# Patient Record
Sex: Female | Born: 1935 | Race: White | Hispanic: No | Marital: Married | Smoking: Former smoker
Health system: Southern US, Community
[De-identification: ages and names within clinical notes are randomized; demographics above are authoritative.]

## PROBLEM LIST (undated history)

## (undated) DIAGNOSIS — I340 Nonrheumatic mitral (valve) insufficiency: Secondary | ICD-10-CM

## (undated) DIAGNOSIS — I739 Peripheral vascular disease, unspecified: Principal | ICD-10-CM

## (undated) DIAGNOSIS — I2510 Atherosclerotic heart disease of native coronary artery without angina pectoris: Secondary | ICD-10-CM

## (undated) DIAGNOSIS — K219 Gastro-esophageal reflux disease without esophagitis: Secondary | ICD-10-CM

## (undated) DIAGNOSIS — R9430 Abnormal result of cardiovascular function study, unspecified: Secondary | ICD-10-CM

## (undated) DIAGNOSIS — R05 Cough: Secondary | ICD-10-CM

## (undated) DIAGNOSIS — IMO0002 Reserved for concepts with insufficient information to code with codable children: Secondary | ICD-10-CM

## (undated) DIAGNOSIS — G309 Alzheimer's disease, unspecified: Secondary | ICD-10-CM

## (undated) DIAGNOSIS — I779 Disorder of arteries and arterioles, unspecified: Secondary | ICD-10-CM

## (undated) DIAGNOSIS — F419 Anxiety disorder, unspecified: Secondary | ICD-10-CM

## (undated) DIAGNOSIS — F0280 Dementia in other diseases classified elsewhere without behavioral disturbance: Secondary | ICD-10-CM

## (undated) DIAGNOSIS — R059 Cough, unspecified: Secondary | ICD-10-CM

## (undated) HISTORY — DX: Gastro-esophageal reflux disease without esophagitis: K21.9

## (undated) HISTORY — DX: Cough: R05

## (undated) HISTORY — DX: Reserved for concepts with insufficient information to code with codable children: IMO0002

## (undated) HISTORY — DX: Nonrheumatic mitral (valve) insufficiency: I34.0

## (undated) HISTORY — DX: Cough, unspecified: R05.9

## (undated) HISTORY — DX: Disorder of arteries and arterioles, unspecified: I77.9

## (undated) HISTORY — DX: Abnormal result of cardiovascular function study, unspecified: R94.30

## (undated) HISTORY — PX: CHOLECYSTECTOMY: SHX55

## (undated) HISTORY — DX: Peripheral vascular disease, unspecified: I73.9

---

## 1997-05-22 ENCOUNTER — Encounter: Admission: RE | Admit: 1997-05-22 | Discharge: 1997-08-20 | Payer: Self-pay | Admitting: *Deleted

## 2000-06-06 ENCOUNTER — Other Ambulatory Visit: Admission: RE | Admit: 2000-06-06 | Discharge: 2000-06-06 | Payer: Self-pay | Admitting: Obstetrics and Gynecology

## 2003-11-20 ENCOUNTER — Ambulatory Visit (HOSPITAL_COMMUNITY): Admission: RE | Admit: 2003-11-20 | Discharge: 2003-11-20 | Payer: Self-pay | Admitting: Neurology

## 2005-06-11 ENCOUNTER — Ambulatory Visit (HOSPITAL_BASED_OUTPATIENT_CLINIC_OR_DEPARTMENT_OTHER): Admission: RE | Admit: 2005-06-11 | Discharge: 2005-06-11 | Payer: Self-pay | Admitting: Orthopedic Surgery

## 2006-10-28 ENCOUNTER — Ambulatory Visit: Payer: Self-pay

## 2008-07-16 ENCOUNTER — Encounter: Payer: Self-pay | Admitting: Pulmonary Disease

## 2008-07-16 ENCOUNTER — Encounter: Payer: Self-pay | Admitting: Cardiology

## 2008-12-02 ENCOUNTER — Encounter: Payer: Self-pay | Admitting: Cardiology

## 2008-12-03 ENCOUNTER — Ambulatory Visit: Payer: Self-pay | Admitting: Cardiology

## 2008-12-09 ENCOUNTER — Telehealth (INDEPENDENT_AMBULATORY_CARE_PROVIDER_SITE_OTHER): Payer: Self-pay | Admitting: *Deleted

## 2008-12-10 ENCOUNTER — Ambulatory Visit: Payer: Self-pay | Admitting: Cardiology

## 2008-12-10 ENCOUNTER — Encounter (HOSPITAL_COMMUNITY): Admission: RE | Admit: 2008-12-10 | Discharge: 2009-01-30 | Payer: Self-pay | Admitting: Cardiology

## 2008-12-10 ENCOUNTER — Ambulatory Visit: Payer: Self-pay

## 2008-12-13 ENCOUNTER — Ambulatory Visit: Payer: Self-pay

## 2008-12-13 ENCOUNTER — Ambulatory Visit: Payer: Self-pay | Admitting: Cardiology

## 2009-05-01 ENCOUNTER — Ambulatory Visit (HOSPITAL_BASED_OUTPATIENT_CLINIC_OR_DEPARTMENT_OTHER): Admission: RE | Admit: 2009-05-01 | Discharge: 2009-05-01 | Payer: Self-pay | Admitting: Orthopedic Surgery

## 2010-04-26 LAB — BASIC METABOLIC PANEL
BUN: 24 mg/dL — ABNORMAL HIGH (ref 6–23)
CO2: 26 mEq/L (ref 19–32)
Calcium: 9.4 mg/dL (ref 8.4–10.5)
Chloride: 102 mEq/L (ref 96–112)
Creatinine, Ser: 1.15 mg/dL (ref 0.4–1.2)
GFR calc Af Amer: 56 mL/min — ABNORMAL LOW (ref >60)
GFR calc non Af Amer: 46 mL/min — ABNORMAL LOW (ref >60)
Glucose, Bld: 336 mg/dL — ABNORMAL HIGH (ref 70–99)
Potassium: 4.6 mEq/L (ref 3.5–5.1)
Sodium: 135 mEq/L (ref 135–145)

## 2010-04-26 LAB — GLUCOSE, CAPILLARY
Glucose-Capillary: 316 mg/dL — ABNORMAL HIGH (ref 70–99)
Glucose-Capillary: 341 mg/dL — ABNORMAL HIGH (ref 70–99)

## 2010-04-26 LAB — POCT HEMOGLOBIN-HEMACUE: Hemoglobin: 13.6 g/dL (ref 12.0–15.0)

## 2010-06-19 NOTE — Op Note (Signed)
[name], [name] NO.:  [id number]   MEDICAL RECORD NO.:  [id number]          PATIENT TYPE:  AMB   LOCATION:  DSC                          FACILITY:  [facility]   PHYSICIAN:  Katy Fitch. Sypher, M.D. DATE OF BIRTH:  01-Dec-1933   DATE OF PROCEDURE:  06/11/2005  DATE OF DISCHARGE:                                 OPERATIVE REPORT   PREOPERATIVE DIAGNOSIS:  Chronic stenosing tenosynovitis of left long finger  at A1 pulley.   POSTOPERATIVE DIAGNOSIS:  Chronic stenosing tenosynovitis of left long  finger at A1 pulley.   OPERATIONS:  Release of left long finger A1 pulley.   OPERATIONS:  Savannah [name], M.D.   ASSISTANT:  Annye Rusk, P.A.-C.   ANESTHESIA:  Is 0.25% Marcaine and 2% lidocaine metacarpal head level block  of left long finger, supplemented by IV sedation. Supervising  anesthesiologist is Dr. Jacklynn Bue.   INDICATIONS:  Savannah [name] is a 77 year old woman with a history of  chronic stenosing tenosynovitis.  She has failed nonoperative measures.  She  is brought to the operating room at this time for release of her right long  finger A1 pulley.   PROCEDURE:  Savannah [name] was brought to the operating room and placed in  supine position on the operating table.  Following the induction of light  sedation, the left arm was prepped with Betadine soap solution and sterilely  draped.  Following examination of left arm with an Esmarch bandage, the  arterial tourniquet was inflated to 250 mmHg.  Procedure commenced with a  injection of 0.25% Marcaine 2% lidocaine into the path the intended  incision.  When anesthesia was satisfactory, we proceeded to perform a short  incision in the distal palmar crease directly over the palpably thickened A1  pulley.  Subcutaneous tissues were carefully divided, gently retracting the  radial and ulnar proper digital nerves and arteries.   The pulley was quite swollen with a wall thickness of more than 3 mm and  several  areas of mucinous degeneration.   The pulley was released including small A0 pulley proximally.   The flexor tendons were delivered and found be swollen but otherwise normal.   Thereafter, free range of motion of the fingers was recovered.   The wound was irrigated and repaired with a mattress suture of 5-0 nylon.   [name] was advised return for follow-up in one week for suture removal.  She was provided a prescription for Vicodin 5 mg 1 p.o. q.4-6h p.r.n. pain  20 tablets without refill.   She will return to our office for follow-up sooner p.r.n. problems.      Katy Fitch Sypher, M.D.  Electronically Signed     RVS/MEDQ  D:  06/11/2005  T:  06/12/2005  Job:  [id number]

## 2011-04-16 ENCOUNTER — Telehealth: Payer: Self-pay | Admitting: Cardiology

## 2011-04-16 DIAGNOSIS — I6529 Occlusion and stenosis of unspecified carotid artery: Secondary | ICD-10-CM

## 2011-04-16 NOTE — Telephone Encounter (Signed)
New msg Pt's husband wants to know if it is time for her to have carotid artery test done again. Please call

## 2011-04-16 NOTE — Telephone Encounter (Signed)
Appears this was last done in November 2010. I spoke with pt's husband and told him pt is due to have done.   Husband transferred to scheduling to make appt. Will forward note to Dr. Myrtis Ser to determine if pt needs office visit

## 2011-04-18 NOTE — Telephone Encounter (Signed)
She does not need office visit. Dr. Felipa Eth is her primary MD.

## 2011-04-19 NOTE — Telephone Encounter (Signed)
Pt was notified.  

## 2011-05-14 ENCOUNTER — Encounter (INDEPENDENT_AMBULATORY_CARE_PROVIDER_SITE_OTHER): Payer: Medicare Other

## 2011-05-14 DIAGNOSIS — I6529 Occlusion and stenosis of unspecified carotid artery: Secondary | ICD-10-CM

## 2011-05-23 ENCOUNTER — Encounter: Payer: Self-pay | Admitting: Cardiology

## 2011-05-23 NOTE — Progress Notes (Unsigned)
   I have been reviewing the patient's carotid Doppler results. There has been some progression. The patient is aware that she will need a 6 month followup study. I have not seen this patient for greater than 2 years. We will make plans for elective followup with me so that I can be sure that I am completely involved in her cardiovascular care.  Jerral Bonito, MD

## 2011-05-28 ENCOUNTER — Encounter: Payer: Self-pay | Admitting: Cardiology

## 2011-05-28 ENCOUNTER — Ambulatory Visit (INDEPENDENT_AMBULATORY_CARE_PROVIDER_SITE_OTHER): Payer: Medicare Other | Admitting: Cardiology

## 2011-05-28 VITALS — BP 130/72 | HR 68 | Resp 18 | Ht 67.0 in | Wt 205.0 lb

## 2011-05-28 DIAGNOSIS — I779 Disorder of arteries and arterioles, unspecified: Secondary | ICD-10-CM

## 2011-05-28 DIAGNOSIS — R9430 Abnormal result of cardiovascular function study, unspecified: Secondary | ICD-10-CM | POA: Insufficient documentation

## 2011-05-28 DIAGNOSIS — R0989 Other specified symptoms and signs involving the circulatory and respiratory systems: Secondary | ICD-10-CM

## 2011-05-28 DIAGNOSIS — K219 Gastro-esophageal reflux disease without esophagitis: Secondary | ICD-10-CM | POA: Insufficient documentation

## 2011-05-28 DIAGNOSIS — R079 Chest pain, unspecified: Secondary | ICD-10-CM

## 2011-05-28 DIAGNOSIS — E119 Type 2 diabetes mellitus without complications: Secondary | ICD-10-CM | POA: Insufficient documentation

## 2011-05-28 DIAGNOSIS — I2510 Atherosclerotic heart disease of native coronary artery without angina pectoris: Secondary | ICD-10-CM | POA: Insufficient documentation

## 2011-05-28 DIAGNOSIS — R05 Cough: Secondary | ICD-10-CM | POA: Insufficient documentation

## 2011-05-28 NOTE — Progress Notes (Signed)
   HPI  Patient is seen today to followup cardiology care. She had chest pain in the past but no proven coronary disease by nuclear scanning. She has had carotid Dopplers and there has been progression. She's being followed carefully. She has unusual sensation in her right shoulder that happens each day. It does not sound like angina.  As part of today's evaluation I have reviewed her old cardiology records and carefully updated to the new electronic medical record.  No Known Allergies  Current Outpatient Prescriptions  Medication Sig Dispense Refill  . B-D ULTRAFINE III SHORT PEN 31G X 8 MM MISC       . BYETTA 10 MCG PEN 10 MCG/0.04ML SOLN       . EXELON 9.5 MG/24HR       . pravastatin (PRAVACHOL) 40 MG tablet         History   Social History  . Marital Status: Married    Spouse Name: N/A    Number of Children: N/A  . Years of Education: N/A   Occupational History  . Not on file.   Social History Main Topics  . Smoking status: Former Games developer  . Smokeless tobacco: Not on file  . Alcohol Use: Not on file  . Drug Use: Not on file  . Sexually Active: Not on file   Other Topics Concern  . Not on file   Social History Narrative  . No narrative on file    No family history on file.  Past Medical History  Diagnosis Date  . Carotid artery disease     Doppler,  April, 2013, 0-39% R. ICA,  60-79% LICA.  Some progression on the left, plan followup 6 months  . Chest pain     Nuclear, November, 2010, no ischemia, normal ejection fraction  . Ejection fraction     EF normal, nuclear, 2010  . GERD (gastroesophageal reflux disease)   . Cough     May be related to GERD  . Diabetes mellitus     No past surgical history on file.  ROS  Patient denies fever, chills, headache, sweats, rash, change in vision, change in hearing, chest pain, cough, nausea vomiting, urinary symptoms. All other systems are reviewed and are negative.  PHYSICAL EXAM  Patient is oriented to person time  and place. Affect is normal. There is no jugulovenous distention. Lungs are clear. Respiratory effort is nonlabored. Cardiac exam reveals S1 and S2. There no clicks or significant murmurs. The abdomen is soft. There is no peripheral edema. There no musculoskeletal deformities. There are no skin rashes.  Filed Vitals:   05/28/11 0907  BP: 130/72  Pulse: 68  Resp: 18  Height: 5\' 7"  (1.702 m)  Weight: 205 lb (92.987 kg)   EKG is done today and reviewed by me. I've carefully compared to old tracings. In the past she has had very small R waves in leads 3 and aVF. This is unchanged. There is also decreased R wave in lead V3. This is unchanged. There is no obvious change from her prior EKG.  ASSESSMENT & PLAN

## 2011-05-28 NOTE — Assessment & Plan Note (Signed)
She is not complaining of a cough today. Historically this may have been related to her GERD.  I will see her for cardiology followup in 2 years.

## 2011-05-28 NOTE — Assessment & Plan Note (Signed)
Patient has significant carotid artery disease. This is being followed carefully.

## 2011-05-28 NOTE — Patient Instructions (Signed)
Your physician wants you to follow-up in:  2 years. You will receive a reminder letter in the mail two months in advance. If you don't receive a letter, please call our office to schedule the follow-up appointment.   

## 2011-05-28 NOTE — Assessment & Plan Note (Signed)
The patient has not had any significant chest pain. Her EKG is abnormal but unchanged from the past. No further workup.

## 2011-12-09 ENCOUNTER — Other Ambulatory Visit: Payer: Self-pay | Admitting: *Deleted

## 2011-12-09 ENCOUNTER — Encounter (HOSPITAL_COMMUNITY): Payer: Self-pay | Admitting: Emergency Medicine

## 2011-12-09 ENCOUNTER — Observation Stay (HOSPITAL_COMMUNITY): Payer: Medicare Other

## 2011-12-09 ENCOUNTER — Emergency Department (HOSPITAL_COMMUNITY): Payer: Medicare Other

## 2011-12-09 ENCOUNTER — Observation Stay (HOSPITAL_COMMUNITY)
Admission: EM | Admit: 2011-12-09 | Discharge: 2011-12-11 | Disposition: A | Discharge status: Home / Self Care | Payer: Medicare Other | Attending: Family Medicine | Admitting: Family Medicine

## 2011-12-09 DIAGNOSIS — R269 Unspecified abnormalities of gait and mobility: Secondary | ICD-10-CM | POA: Insufficient documentation

## 2011-12-09 DIAGNOSIS — IMO0002 Reserved for concepts with insufficient information to code with codable children: Secondary | ICD-10-CM

## 2011-12-09 DIAGNOSIS — I779 Disorder of arteries and arterioles, unspecified: Secondary | ICD-10-CM

## 2011-12-09 DIAGNOSIS — F0280 Dementia in other diseases classified elsewhere without behavioral disturbance: Secondary | ICD-10-CM | POA: Insufficient documentation

## 2011-12-09 DIAGNOSIS — R0602 Shortness of breath: Secondary | ICD-10-CM | POA: Insufficient documentation

## 2011-12-09 DIAGNOSIS — J189 Pneumonia, unspecified organism: Secondary | ICD-10-CM

## 2011-12-09 DIAGNOSIS — I6529 Occlusion and stenosis of unspecified carotid artery: Secondary | ICD-10-CM

## 2011-12-09 DIAGNOSIS — R9430 Abnormal result of cardiovascular function study, unspecified: Secondary | ICD-10-CM

## 2011-12-09 DIAGNOSIS — G309 Alzheimer's disease, unspecified: Secondary | ICD-10-CM | POA: Insufficient documentation

## 2011-12-09 DIAGNOSIS — E860 Dehydration: Secondary | ICD-10-CM | POA: Insufficient documentation

## 2011-12-09 DIAGNOSIS — R0989 Other specified symptoms and signs involving the circulatory and respiratory systems: Secondary | ICD-10-CM

## 2011-12-09 DIAGNOSIS — R079 Chest pain, unspecified: Secondary | ICD-10-CM

## 2011-12-09 DIAGNOSIS — K219 Gastro-esophageal reflux disease without esophagitis: Secondary | ICD-10-CM

## 2011-12-09 DIAGNOSIS — R109 Unspecified abdominal pain: Secondary | ICD-10-CM | POA: Insufficient documentation

## 2011-12-09 DIAGNOSIS — R112 Nausea with vomiting, unspecified: Secondary | ICD-10-CM

## 2011-12-09 DIAGNOSIS — R05 Cough: Secondary | ICD-10-CM

## 2011-12-09 DIAGNOSIS — E119 Type 2 diabetes mellitus without complications: Secondary | ICD-10-CM | POA: Insufficient documentation

## 2011-12-09 DIAGNOSIS — R51 Headache: Principal | ICD-10-CM

## 2011-12-09 DIAGNOSIS — Z23 Encounter for immunization: Secondary | ICD-10-CM | POA: Insufficient documentation

## 2011-12-09 DIAGNOSIS — R059 Cough, unspecified: Secondary | ICD-10-CM

## 2011-12-09 DIAGNOSIS — R519 Headache, unspecified: Secondary | ICD-10-CM | POA: Diagnosis present

## 2011-12-09 DIAGNOSIS — Z66 Do not resuscitate: Secondary | ICD-10-CM | POA: Insufficient documentation

## 2011-12-09 HISTORY — DX: Anxiety disorder, unspecified: F41.9

## 2011-12-09 HISTORY — DX: Dementia in other diseases classified elsewhere, unspecified severity, without behavioral disturbance, psychotic disturbance, mood disturbance, and anxiety: F02.80

## 2011-12-09 HISTORY — DX: Alzheimer's disease, unspecified: G30.9

## 2011-12-09 LAB — CBC
HCT: 36.2 % (ref 36.0–46.0)
Hemoglobin: 12.4 g/dL (ref 12.0–15.0)
MCH: 31.6 pg (ref 26.0–34.0)
MCHC: 34.3 g/dL (ref 30.0–36.0)
MCV: 92.1 fL (ref 78.0–100.0)
Platelets: 226 10*3/uL (ref 150–400)
RBC: 3.93 MIL/uL (ref 3.87–5.11)
RDW: 13 % (ref 11.5–15.5)
WBC: 11.5 10*3/uL — ABNORMAL HIGH (ref 4.0–10.5)

## 2011-12-09 LAB — URINALYSIS, ROUTINE W REFLEX MICROSCOPIC
Bilirubin Urine: NEGATIVE
Glucose, UA: NEGATIVE mg/dL
Hgb urine dipstick: NEGATIVE
Ketones, ur: NEGATIVE mg/dL
Leukocytes, UA: NEGATIVE
Nitrite: NEGATIVE
Protein, ur: NEGATIVE mg/dL
Specific Gravity, Urine: 1.01 (ref 1.005–1.030)
Urobilinogen, UA: 0.2 mg/dL (ref 0.0–1.0)
pH: 8 (ref 5.0–8.0)

## 2011-12-09 LAB — COMPREHENSIVE METABOLIC PANEL
ALT: 11 U/L (ref 0–35)
AST: 24 U/L (ref 0–37)
Albumin: 3.6 g/dL (ref 3.5–5.2)
Alkaline Phosphatase: 67 U/L (ref 39–117)
BUN: 23 mg/dL (ref 6–23)
CO2: 20 mEq/L (ref 19–32)
Calcium: 9.3 mg/dL (ref 8.4–10.5)
Chloride: 101 mEq/L (ref 96–112)
Creatinine, Ser: 1.03 mg/dL (ref 0.50–1.10)
GFR calc Af Amer: 60 mL/min — ABNORMAL LOW (ref >90)
GFR calc non Af Amer: 51 mL/min — ABNORMAL LOW (ref >90)
Glucose, Bld: 139 mg/dL — ABNORMAL HIGH (ref 70–99)
Potassium: 4.3 mEq/L (ref 3.5–5.1)
Sodium: 135 mEq/L (ref 135–145)
Total Bilirubin: 0.3 mg/dL (ref 0.3–1.2)
Total Protein: 6.8 g/dL (ref 6.0–8.3)

## 2011-12-09 LAB — TROPONIN I
Troponin I: <0.3 ng/mL (ref <0.30)
Troponin I: <0.3 ng/mL (ref <0.30)
Troponin I: <0.3 ng/mL (ref <0.30)

## 2011-12-09 LAB — LACTIC ACID, PLASMA: Lactic Acid, Venous: 3.7 mmol/L — ABNORMAL HIGH (ref 0.5–2.2)

## 2011-12-09 LAB — POCT I-STAT TROPONIN I: Troponin i, poc: 0.01 ng/mL (ref 0.00–0.08)

## 2011-12-09 LAB — GLUCOSE, CAPILLARY

## 2011-12-09 LAB — HEMOGLOBIN A1C: Mean Plasma Glucose: 123 mg/dL — ABNORMAL HIGH (ref <117)

## 2011-12-09 MED ORDER — RIVASTIGMINE 13.3 MG/24HR TD PT24: 13.3000 mg | MEDICATED_PATCH | Freq: Every day | TRANSDERMAL | Status: DC

## 2011-12-09 MED ORDER — HYDROCODONE-ACETAMINOPHEN 5-325 MG PO TABS
1.0000 | ORAL_TABLET | ORAL | Status: DC | PRN
  Administered 2011-12-09: 1 via ORAL
  Administered 2011-12-09: 2 via ORAL
  Filled 2011-12-09: qty 1
  Filled 2011-12-09: qty 2

## 2011-12-09 MED ORDER — HYDROMORPHONE HCL PF 1 MG/ML IJ SOLN: 1.0000 mg | INTRAMUSCULAR | Status: AC | PRN

## 2011-12-09 MED ORDER — LISINOPRIL 5 MG PO TABS
5.0000 mg | ORAL_TABLET | Freq: Every day | ORAL | Status: DC
  Administered 2011-12-10: 5 mg via ORAL
  Filled 2011-12-09: qty 1

## 2011-12-09 MED ORDER — ONDANSETRON HCL 4 MG PO TABS
4.0000 mg | ORAL_TABLET | Freq: Four times a day (QID) | ORAL | Status: DC | PRN
  Filled 2011-12-09: qty 1

## 2011-12-09 MED ORDER — ONDANSETRON HCL 4 MG/2ML IJ SOLN
4.0000 mg | Freq: Four times a day (QID) | INTRAMUSCULAR | Status: DC | PRN
  Administered 2011-12-09: 4 mg via INTRAVENOUS
  Filled 2011-12-09: qty 2

## 2011-12-09 MED ORDER — SODIUM CHLORIDE 0.9 % IV SOLN
INTRAVENOUS | Status: DC
  Administered 2011-12-09: 10 mL/h via INTRAVENOUS

## 2011-12-09 MED ORDER — MEMANTINE HCL 10 MG PO TABS
10.0000 mg | ORAL_TABLET | Freq: Two times a day (BID) | ORAL | Status: DC
  Administered 2011-12-09 – 2011-12-11 (×5): 10 mg via ORAL
  Filled 2011-12-09 (×6): qty 1

## 2011-12-09 MED ORDER — DEXTROSE 5 % IV SOLN
500.0000 mg | INTRAVENOUS | Status: DC
  Administered 2011-12-10 – 2011-12-11 (×2): 500 mg via INTRAVENOUS
  Filled 2011-12-09 (×2): qty 500

## 2011-12-09 MED ORDER — DEXTROSE 5 % IV SOLN
500.0000 mg | Freq: Once | INTRAVENOUS | Status: AC
  Administered 2011-12-09: 500 mg via INTRAVENOUS
  Filled 2011-12-09: qty 500

## 2011-12-09 MED ORDER — SODIUM CHLORIDE 0.9 % IV SOLN
INTRAVENOUS | Status: DC
  Administered 2011-12-09: 01:00:00 via INTRAVENOUS
  Administered 2011-12-09: 10 mL via INTRAVENOUS
  Administered 2011-12-09: 10 mL/h via INTRAVENOUS
  Administered 2011-12-11: 04:00:00 via INTRAVENOUS

## 2011-12-09 MED ORDER — METOPROLOL SUCCINATE ER 50 MG PO TB24
50.0000 mg | ORAL_TABLET | Freq: Every day | ORAL | Status: DC
  Administered 2011-12-09 – 2011-12-11 (×3): 50 mg via ORAL
  Filled 2011-12-09 (×3): qty 1

## 2011-12-09 MED ORDER — FUROSEMIDE 20 MG PO TABS
20.0000 mg | ORAL_TABLET | Freq: Every day | ORAL | Status: DC
Start: 2011-12-09 — End: 2011-12-10
  Administered 2011-12-09 – 2011-12-10 (×2): 20 mg via ORAL
  Filled 2011-12-09 (×2): qty 1

## 2011-12-09 MED ORDER — INSULIN GLARGINE 100 UNIT/ML ~~LOC~~ SOLN
35.0000 [IU] | Freq: Every day | SUBCUTANEOUS | Status: DC
  Administered 2011-12-09 – 2011-12-11 (×3): 35 [IU] via SUBCUTANEOUS

## 2011-12-09 MED ORDER — RIVASTIGMINE 9.5 MG/24HR TD PT24
9.5000 mg | MEDICATED_PATCH | Freq: Every day | TRANSDERMAL | Status: DC
  Filled 2011-12-09: qty 1

## 2011-12-09 MED ORDER — IOHEXOL 300 MG/ML  SOLN
100.0000 mL | Freq: Once | INTRAMUSCULAR | Status: AC | PRN
  Administered 2011-12-09: 100 mL via INTRAVENOUS

## 2011-12-09 MED ORDER — INSULIN ASPART 100 UNIT/ML ~~LOC~~ SOLN: 0.0000 [IU] | Freq: Three times a day (TID) | SUBCUTANEOUS | Status: DC

## 2011-12-09 MED ORDER — MORPHINE SULFATE 4 MG/ML IJ SOLN
4.0000 mg | Freq: Once | INTRAMUSCULAR | Status: AC
  Administered 2011-12-09: 4 mg via INTRAVENOUS
  Filled 2011-12-09: qty 1

## 2011-12-09 MED ORDER — TRAMADOL HCL 50 MG PO TABS
50.0000 mg | ORAL_TABLET | Freq: Four times a day (QID) | ORAL | Status: DC | PRN
  Administered 2011-12-10 – 2011-12-11 (×2): 50 mg via ORAL
  Filled 2011-12-09 (×2): qty 1

## 2011-12-09 MED ORDER — ONDANSETRON HCL 4 MG/2ML IJ SOLN
4.0000 mg | Freq: Four times a day (QID) | INTRAMUSCULAR | Status: DC | PRN
  Administered 2011-12-09: 4 mg via INTRAVENOUS

## 2011-12-09 MED ORDER — DEXTROSE 5 % IV SOLN
1.0000 g | INTRAVENOUS | Status: DC
  Administered 2011-12-10 – 2011-12-11 (×2): 1 g via INTRAVENOUS
  Filled 2011-12-09 (×2): qty 10

## 2011-12-09 MED ORDER — PNEUMOCOCCAL VAC POLYVALENT 25 MCG/0.5ML IJ INJ
0.5000 mL | INJECTION | INTRAMUSCULAR | Status: AC
  Administered 2011-12-10: 0.5 mL via INTRAMUSCULAR
  Filled 2011-12-09: qty 0.5

## 2011-12-09 MED ORDER — DEXTROSE 5 % IV SOLN
1.0000 g | Freq: Once | INTRAVENOUS | Status: AC
  Administered 2011-12-09: 1 g via INTRAVENOUS
  Filled 2011-12-09: qty 10

## 2011-12-09 MED ORDER — ONDANSETRON HCL 4 MG/2ML IJ SOLN
4.0000 mg | Freq: Once | INTRAMUSCULAR | Status: AC
  Administered 2011-12-09: 4 mg via INTRAVENOUS
  Filled 2011-12-09: qty 2

## 2011-12-09 MED ORDER — DIVALPROEX SODIUM ER 500 MG PO TB24
500.0000 mg | ORAL_TABLET | Freq: Every day | ORAL | Status: DC
  Administered 2011-12-09 – 2011-12-11 (×3): 500 mg via ORAL
  Filled 2011-12-09 (×3): qty 1

## 2011-12-09 MED ORDER — ONDANSETRON HCL 4 MG/2ML IJ SOLN
4.0000 mg | Freq: Three times a day (TID) | INTRAMUSCULAR | Status: AC | PRN
  Administered 2011-12-09: 4 mg via INTRAVENOUS
  Filled 2011-12-09 (×2): qty 2

## 2011-12-09 MED ORDER — RIVASTIGMINE 9.5 MG/24HR TD PT24
14.1000 mg | MEDICATED_PATCH | Freq: Every day | TRANSDERMAL | Status: DC
  Administered 2011-12-09 – 2011-12-11 (×3): 14.1 mg via TRANSDERMAL
  Filled 2011-12-09 (×3): qty 1

## 2011-12-09 MED ORDER — LISINOPRIL 10 MG PO TABS
10.0000 mg | ORAL_TABLET | Freq: Every day | ORAL | Status: DC
  Administered 2011-12-09: 10 mg via ORAL
  Filled 2011-12-09: qty 1

## 2011-12-09 MED ORDER — LORAZEPAM 0.5 MG PO TABS
1.0000 mg | ORAL_TABLET | ORAL | Status: DC | PRN
  Administered 2011-12-09 (×2): 1 mg via ORAL
  Filled 2011-12-09 (×2): qty 1
  Filled 2011-12-09: qty 2

## 2011-12-09 MED ORDER — HYDRALAZINE HCL 25 MG PO TABS
25.0000 mg | ORAL_TABLET | Freq: Four times a day (QID) | ORAL | Status: DC | PRN
  Filled 2011-12-09: qty 1

## 2011-12-09 MED ORDER — SERTRALINE HCL 100 MG PO TABS
100.0000 mg | ORAL_TABLET | Freq: Every day | ORAL | Status: DC
  Administered 2011-12-09 – 2011-12-11 (×3): 100 mg via ORAL
  Filled 2011-12-09 (×3): qty 1

## 2011-12-09 MED ORDER — SODIUM CHLORIDE 0.9 % IV BOLUS (SEPSIS)
1000.0000 mL | Freq: Once | INTRAVENOUS | Status: AC
  Administered 2011-12-09: 1000 mL via INTRAVENOUS

## 2011-12-09 MED ORDER — MEMANTINE HCL ER 28 MG PO CP24: 28.0000 mg | ORAL_CAPSULE | Freq: Every day | ORAL | Status: DC

## 2011-12-09 MED ORDER — IOHEXOL 300 MG/ML  SOLN
20.0000 mL | INTRAMUSCULAR | Status: AC
  Administered 2011-12-09 (×2): 20 mL via ORAL

## 2011-12-09 NOTE — Progress Notes (Signed)
Utilization review completed.  

## 2011-12-09 NOTE — Plan of Care (Signed)
Problem: Phase I Progression Outcomes Goal: Dyspnea controlled at rest (HF) Outcome: Progressing     

## 2011-12-09 NOTE — H&P (Signed)
PCP:   Hoyle Sauer, MD   Chief Complaint:  Headache  HPI: 78 yr old female who was brought to the hospital from home after she experienced worst headache, which woke her up around 11 pm. Patient has alzheimer's dementia, and is a poor historian. History obtained from the husband and daughter at the bedside. Patient also complains of diffuse generalized body aches. She received one dose of morphine in the ambulance for pain, and vomited twice in the ED. Patient had CT abdomen/pelvis which was normal, Chest xray shows bibasilar infiltrates, ? Atelectasis. Patient has elevated lactate and mild elevation of wbc.She denies photophobia, no neck rigidity.  Review of Systems:  HEENT: Denies , blurred vision,positive  runny nose, no sore throat,  Neck: Denies thyroid problems,lymphadenopathy Chest : Denies shortness of breath, no history of COPD Heart : Denies h/o  coronary arterey disease GI: Positive diiarrhea GU: Denies dysuria, urgency, frequency of urination, hematuria Neuro: Denies stroke, seizures, syncope Psych: h/o anxiety   Allergies:  No Known Allergies    Past Medical History  Diagnosis Date  . Carotid artery disease     Doppler,  April, 2013, 0-39% R. ICA,  60-79% LICA.  Some progression on the left, plan followup 6 months  . Chest pain     Nuclear, November, 2010, no ischemia, normal ejection fraction  . Ejection fraction     EF normal, nuclear, 2010  . GERD (gastroesophageal reflux disease)   . Cough     May be related to GERD  . Diabetes mellitus   . Alzheimer's dementia     History reviewed. No pertinent past surgical history.  Prior to Admission medications   Medication Sig Start Date End Date Taking? Authorizing Provider  EXELON 9.5 MG/24HR Place 1 patch onto the skin daily.  03/19/11  Yes Historical Provider, MD  insulin glargine (LANTUS) 100 UNIT/ML injection Inject 35 Units into the skin daily.   Yes Historical Provider, MD  insulin lispro (HUMALOG) 100  UNIT/ML injection Inject 5 Units into the skin 2 (two) times daily.   Yes Historical Provider, MD  pravastatin (PRAVACHOL) 40 MG tablet Take 40 mg by mouth daily.  05/13/11  Yes Historical Provider, MD  B-D ULTRAFINE III SHORT PEN 31G X 8 MM MISC  03/12/11   Historical Provider, MD    Social History:  reports that she has quit smoking. She does not have any smokeless tobacco history on file. Her alcohol and drug histories not on file.  Family history: noncontributory   Physical Exam: Blood pressure 163/58, pulse 54, temperature 97 F (36.1 C), temperature source Rectal, resp. rate 13, SpO2 100.00%. Constitutional:   Patient is a well-developed and well-nourished  Female in no acute distress and cooperative with exam. Head: Normocephalic and atraumatic Neck: No neck rigidity, supple Mouth: Mucus membranes moist Eyes: PERRL, EOMI, conjunctivae normal Neck: Supple, No Thyromegaly Cardiovascular: RRR, S1 normal, S2 normal Pulmonary/Chest: CTAB, no wheezes, rales, or rhonchi Abdominal: Soft. Non-tender, non-distended, bowel sounds are normal, no masses, organomegaly, or guarding present.  Neurological: A&O x3, Strenght is normal and symmetric bilaterally, cranial nerve II-XII are grossly intact, no focal motor deficit, sensory intact to light touch bilaterally.  Extremities : No Cyanosis, Clubbing or Edema   Labs on Admission:  Results for orders placed during the hospital encounter of 12/09/11 (from the past 48 hour(s))  URINALYSIS, ROUTINE W REFLEX MICROSCOPIC     Status: Abnormal   Collection Time   12/09/11 12:54 AM  Component Value Range Comment   Color, Urine YELLOW  YELLOW    APPearance CLOUDY (*) CLEAR    Specific Gravity, Urine 1.010  1.005 - 1.030    pH 8.0  5.0 - 8.0    Glucose, UA NEGATIVE  NEGATIVE mg/dL    Hgb urine dipstick NEGATIVE  NEGATIVE    Bilirubin Urine NEGATIVE  NEGATIVE    Ketones, ur NEGATIVE  NEGATIVE mg/dL    Protein, ur NEGATIVE  NEGATIVE mg/dL     Urobilinogen, UA 0.2  0.0 - 1.0 mg/dL    Nitrite NEGATIVE  NEGATIVE    Leukocytes, UA NEGATIVE  NEGATIVE MICROSCOPIC NOT DONE ON URINES WITH NEGATIVE PROTEIN, BLOOD, LEUKOCYTES, NITRITE, OR GLUCOSE <1000 mg/dL.  CBC     Status: Abnormal   Collection Time   12/09/11  1:10 AM      Component Value Range Comment   WBC 11.5 (*) 4.0 - 10.5 K/uL    RBC 3.93  3.87 - 5.11 MIL/uL    Hemoglobin 12.4  12.0 - 15.0 g/dL    HCT 40.9  81.1 - 91.4 %    MCV 92.1  78.0 - 100.0 fL    MCH 31.6  26.0 - 34.0 pg    MCHC 34.3  30.0 - 36.0 g/dL    RDW 78.2  95.6 - 21.3 %    Platelets 226  150 - 400 K/uL   COMPREHENSIVE METABOLIC PANEL     Status: Abnormal   Collection Time   12/09/11  1:10 AM      Component Value Range Comment   Sodium 135  135 - 145 mEq/L    Potassium 4.3  3.5 - 5.1 mEq/L HEMOLYSIS AT THIS LEVEL MAY AFFECT RESULT   Chloride 101  96 - 112 mEq/L    CO2 20  19 - 32 mEq/L    Glucose, Bld 139 (*) 70 - 99 mg/dL    BUN 23  6 - 23 mg/dL    Creatinine, Ser 0.86  0.50 - 1.10 mg/dL    Calcium 9.3  8.4 - 57.8 mg/dL    Total Protein 6.8  6.0 - 8.3 g/dL    Albumin 3.6  3.5 - 5.2 g/dL    AST 24  0 - 37 U/L    ALT 11  0 - 35 U/L    Alkaline Phosphatase 67  39 - 117 U/L    Total Bilirubin 0.3  0.3 - 1.2 mg/dL    GFR calc non Af Amer 51 (*) >90 mL/min    GFR calc Af Amer 60 (*) >90 mL/min   TROPONIN I     Status: Normal   Collection Time   12/09/11  1:10 AM      Component Value Range Comment   Troponin I <0.30  <0.30 ng/mL   LIPASE, BLOOD     Status: Normal   Collection Time   12/09/11  1:10 AM      Component Value Range Comment   Lipase 46  11 - 59 U/L   LACTIC ACID, PLASMA     Status: Abnormal   Collection Time   12/09/11  1:11 AM      Component Value Range Comment   Lactic Acid, Venous 3.7 (*) 0.5 - 2.2 mmol/L   POCT I-STAT TROPONIN I     Status: Normal   Collection Time   12/09/11  6:37 AM      Component Value Range Comment   Troponin i, poc 0.01  0.00 - 0.08  ng/mL    Comment 3                Radiological Exams on Admission: Ct Abdomen Pelvis W Contrast  12/09/2011  *RADIOLOGY REPORT*  Clinical Data: Chest pain and abdominal pain; vomiting.  CT ABDOMEN AND PELVIS WITH CONTRAST  Technique:  Multidetector CT imaging of the abdomen and pelvis was performed following the standard protocol during bolus administration of intravenous contrast.  Contrast: OMNIPAQUE IOHEXOL 300 MG/ML  SOLN  Comparison: Chest and abdominal radiographs performed earlier today at 01:01 a.m.  Findings: The visualized lung bases are clear.  There is mild diffuse prominence of the intrahepatic biliary ducts; this may be within normal limits status post cholecystectomy.  The liver is otherwise unremarkable in appearance.  The spleen is normal in appearance.  The pancreas and adrenal glands are grossly unremarkable.  Nonspecific perinephric stranding is noted bilaterally.  Contrast is noted filling the renal calyces, limiting evaluation for renal stones.  There is no evidence of hydronephrosis.  No obstructing ureteral stones are identified.  Small bilateral extrarenal pelves are noted bilaterally.  No free fluid is identified.  The small bowel is unremarkable in appearance.  The stomach is within normal limits.  No acute vascular abnormalities are seen.  Mild calcification is noted along the abdominal aorta and its branches.  The appendix is not definitely seen; there is no evidence for appendicitis.  Minimal diverticulosis is noted along the sigmoid colon; the colon is otherwise unremarkable.  The bladder is moderately distended and grossly unremarkable.  The patient is status post hysterectomy.  No suspicious adnexal masses are seen.  No inguinal lymphadenopathy is seen.  No acute osseous abnormalities are identified.  Multilevel vacuum phenomenon is noted along the lumbar spine.  IMPRESSION:  1.  No acute abnormalities seen within the abdomen or pelvis. 2.  Minimal diverticulosis along the sigmoid colon, without  evidence of diverticulitis. 3.  Mild calcification along the abdominal aorta and its branches. 4.  Mild prominence of the intrahepatic biliary ducts may be within normal limits status post cholecystectomy. 5.  Mild degenerative change along the lumbar spine.   Original Report Authenticated By: Tonia Ghent, M.D.    Dg Abd Acute W/chest  12/09/2011  *RADIOLOGY REPORT*  Clinical Data: Chest pain, shortness of breath, nausea and vomiting.  ACUTE ABDOMEN SERIES (ABDOMEN 2 VIEW & CHEST 1 VIEW)  Comparison: None.  Findings: The lungs are well-aerated.  Minimal bibasilar opacities likely reflect atelectasis.  There is no evidence of pleural effusion or pneumothorax.  The cardiomediastinal silhouette is within normal limits.  The visualized bowel gas pattern is unremarkable; there is a relative paucity of bowel gas within the abdomen.  Scattered stool is seen within the colon; there is no evidence of small bowel dilatation to suggest obstruction.  No free intra-abdominal air is identified on the provided upright view.  No acute osseous abnormalities are seen; the sacroiliac joints are unremarkable in appearance.  Mild left convex lumbar scoliosis is noted.  IMPRESSION:  1.  Unremarkable bowel gas pattern; no free intra-abdominal air seen. 2.  Minimal bibasilar airspace opacities likely reflect atelectasis; lungs otherwise clear.   Original Report Authenticated By: Tonia Ghent, M.D.     Assessment/Plan  Headache Patient's BP was elevated as pee husband, will obtain CT scan head.  She does not have photophobia or neck rigidity, no fever, so does not appear to have  bacterial meningitis. If headaches do not resolve, and CT head is normal will  obtain Lumbar puncture.  ? Pneumonia Patient has elevated lactate, CXR shows bibasilar opacities, with mildly elevated WBC. Will empirically continue on Rocephin and zithromax.  Diabetes Mellitus Continue lantus and SSI  Hypertension Start Lisinopril 10 mg po  daily Will start prn hydralazine  Code status DNR   Time Spent on Admission: 65 min  LAMA,GAGAN S [facility] Pager: [phone] 12/09/2011, 8:31 AM

## 2011-12-09 NOTE — ED Notes (Signed)
Patient transported to CT 

## 2011-12-09 NOTE — Progress Notes (Signed)
MD notified. PT had a 2.23 pause and brady to 36 when vomiting small amount. Some of the 1400 med were vomited up. Zofran giving. Will continue to monitor

## 2011-12-09 NOTE — Progress Notes (Signed)
Pt not eating. Held Dixie, but gave lanuts. CBG 183

## 2011-12-09 NOTE — ED Notes (Signed)
Per report, patient was at home and had a sudden onset of chest pain with SOB, nausea, vomiting, bradycardia down to 20s in route.  Patient is pale and reports central chest pain.  Patient is alert but disoriented.  Patient was given 4mg  IV Zofran and 12-lead EKG was completed.

## 2011-12-09 NOTE — ED Notes (Signed)
TRANSPORTED TO CT SCAN.  

## 2011-12-09 NOTE — ED Notes (Signed)
Informed radiology that patient has completed her oral contrast and is ready for CT

## 2011-12-09 NOTE — ED Notes (Signed)
Patient transported from [facility] to 34 via stretcher. All personal belongings remains with patient. Family at bedside. Report given to [place], [place]

## 2011-12-09 NOTE — ED Notes (Signed)
Pt had episode of decreased HR, vomiting while admitting MD was at bedside. Family said that patient had something similar happen before. Pt never passed out. Admitting MD ordered Zofran.

## 2011-12-09 NOTE — ED Notes (Signed)
Patient c/o chest pain. No SOB, diaphoresis, dizziness or headache. NAD noted at this time. MD notified. Verbal order given to repeat an EKG.

## 2011-12-09 NOTE — Progress Notes (Signed)
Removed PT foley, 10cc was remove from bubble. Pt tolerated well

## 2011-12-09 NOTE — ED Provider Notes (Signed)
History     CSN: [id number]  Arrival date & time 12/09/11  4098   First MD Initiated Contact with Patient 12/09/11 0042      Chief Complaint  Patient presents with  . Chest Pain    (Consider location/radiation/quality/duration/timing/severity/associated sxs/prior treatment) HPI HX per PT and EMS - at home developed pain " all over" including CP with SOB, then N/V. Pain described as " it hurts" pointing to her chest. Per EMS multiple episodes of N/V with bradycardia while vomiting. No F/C. No known sick contacts. MOD to severe symptoms Past Medical History  Diagnosis Date  . Carotid artery disease     Doppler,  April, 2013, 0-39% R. ICA,  60-79% LICA.  Some progression on the left, plan followup 6 months  . Chest pain     Nuclear, November, 2010, no ischemia, normal ejection fraction  . Ejection fraction     EF normal, nuclear, 2010  . GERD (gastroesophageal reflux disease)   . Cough     May be related to GERD  . Diabetes mellitus     History reviewed. No pertinent past surgical history.  History reviewed. No pertinent family history.  History  Substance Use Topics  . Smoking status: Former Games developer  . Smokeless tobacco: Not on file  . Alcohol Use: Not on file    OB History    Grav Para Term Preterm Abortions TAB SAB Ect Mult Living                  Review of Systems  Constitutional: Negative for fever and chills.  HENT: Negative for neck pain and neck stiffness.   Eyes: Negative for pain.  Respiratory: Positive for shortness of breath.   Cardiovascular: Positive for chest pain.  Gastrointestinal: Positive for nausea, vomiting and abdominal pain.  Genitourinary: Negative for dysuria.  Musculoskeletal: Negative for back pain.  Skin: Negative for rash.  Neurological: Negative for headaches.  All other systems reviewed and are negative.    Allergies  Review of patient's allergies indicates no known allergies.  Home Medications   Current Outpatient Rx    Name  Route  Sig  Dispense  Refill  . BD PEN NEEDLE SHORT U/F 31G X 8 MM MISC               . BYETTA 10 MCG PEN 10 MCG/0.04ML [place] SOLN               . EXELON 9.5 MG/24HR TD PT24               . PRAVASTATIN SODIUM 40 MG PO TABS                 BP 164/65  Pulse 66  Resp 21  SpO2 100%  Physical Exam  Constitutional: She is oriented to person, place, and time. She appears well-developed and well-nourished.  HENT:  Head: Normocephalic and atraumatic.       Dry mm  Eyes: EOM are normal. Pupils are equal, round, and reactive to light. No scleral icterus.  Neck: Neck supple.  Cardiovascular: Normal rate, regular rhythm and intact distal pulses.   Pulmonary/Chest: Effort normal. No respiratory distress. She exhibits no tenderness.  Abdominal: She exhibits no distension. There is no rebound and no guarding.       Mild diffuse tenderness, no acute ABD  Musculoskeletal: Normal range of motion. She exhibits no edema.  Neurological: She is alert and oriented to person, place, and time.  Skin: Skin  is warm and dry.    ED Course  Procedures (including critical care time)  Results for orders placed during the hospital encounter of 12/09/11  CBC      Component Value Range   WBC 11.5 (*) 4.0 - 10.5 K/uL   RBC 3.93  3.87 - 5.11 MIL/uL   Hemoglobin 12.4  12.0 - 15.0 g/dL   HCT 16.1  09.6 - 04.5 %   MCV 92.1  78.0 - 100.0 fL   MCH 31.6  26.0 - 34.0 pg   MCHC 34.3  30.0 - 36.0 g/dL   RDW 40.9  81.1 - 91.4 %   Platelets 226  150 - 400 K/uL  COMPREHENSIVE METABOLIC PANEL      Component Value Range   Sodium 135  135 - 145 mEq/L   Potassium 4.3  3.5 - 5.1 mEq/L   Chloride 101  96 - 112 mEq/L   CO2 20  19 - 32 mEq/L   Glucose, Bld 139 (*) 70 - 99 mg/dL   BUN 23  6 - 23 mg/dL   Creatinine, Ser 7.82  0.50 - 1.10 mg/dL   Calcium 9.3  8.4 - 95.6 mg/dL   Total Protein 6.8  6.0 - 8.3 g/dL   Albumin 3.6  3.5 - 5.2 g/dL   AST 24  0 - 37 U/L   ALT 11  0 - 35 U/L   Alkaline  Phosphatase 67  39 - 117 U/L   Total Bilirubin 0.3  0.3 - 1.2 mg/dL   GFR calc non Af Amer 51 (*) >90 mL/min   GFR calc Af Amer 60 (*) >90 mL/min  TROPONIN I      Component Value Range   Troponin I <0.30  <0.30 ng/mL  URINALYSIS, ROUTINE W REFLEX MICROSCOPIC      Component Value Range   Color, Urine YELLOW  YELLOW   APPearance CLOUDY (*) CLEAR   Specific Gravity, Urine 1.010  1.005 - 1.030   pH 8.0  5.0 - 8.0   Glucose, UA NEGATIVE  NEGATIVE mg/dL   Hgb urine dipstick NEGATIVE  NEGATIVE   Bilirubin Urine NEGATIVE  NEGATIVE   Ketones, ur NEGATIVE  NEGATIVE mg/dL   Protein, ur NEGATIVE  NEGATIVE mg/dL   Urobilinogen, UA 0.2  0.0 - 1.0 mg/dL   Nitrite NEGATIVE  NEGATIVE   Leukocytes, UA NEGATIVE  NEGATIVE  LACTIC ACID, PLASMA      Component Value Range   Lactic Acid, Venous 3.7 (*) 0.5 - 2.2 mmol/L  POCT I-STAT TROPONIN I      Component Value Range   Troponin i, poc 0.01  0.00 - 0.08 ng/mL   Comment 3            Dg Abd Acute W/chest  12/09/2011  *RADIOLOGY REPORT*  Clinical Data: Chest pain, shortness of breath, nausea and vomiting.  ACUTE ABDOMEN SERIES (ABDOMEN 2 VIEW & CHEST 1 VIEW)  Comparison: None.  Findings: The lungs are well-aerated.  Minimal bibasilar opacities likely reflect atelectasis.  There is no evidence of pleural effusion or pneumothorax.  The cardiomediastinal silhouette is within normal limits.  The visualized bowel gas pattern is unremarkable; there is a relative paucity of bowel gas within the abdomen.  Scattered stool is seen within the colon; there is no evidence of small bowel dilatation to suggest obstruction.  No free intra-abdominal air is identified on the provided upright view.  No acute osseous abnormalities are seen; the sacroiliac joints are unremarkable in appearance.  Mild left convex lumbar scoliosis is noted.  IMPRESSION:  1.  Unremarkable bowel gas pattern; no free intra-abdominal air seen. 2.  Minimal bibasilar airspace opacities likely reflect  atelectasis; lungs otherwise clear.   Original Report Authenticated By: Tonia Ghent, M.D.      Date: 12/09/2011  Rate: 65  Rhythm: normal sinus rhythm  QRS Axis: left  Intervals: normal  ST/T Wave abnormalities: nonspecific ST changes  Conduction Disutrbances:none  Narrative Interpretation: multiple PACs  Old EKG Reviewed: unchanged  IVfs, labs and imaging   MDM   ABd pain, N/V with CP/ SOB  Pain control, cardiac monitoring, IVFs, ECG and labs reviewed, Ct A/P pending, Plan admit         Sunnie Nielsen, MD 12/09/11 2320

## 2011-12-09 NOTE — ED Provider Notes (Signed)
Received sign out from Dr. Dierdre Highman. Patient is a 78 yo F here with diffuse myalgias "everywhere hurts". She had chest pain, abdominal pain. EKG nondiagnostic, CXR showed bilateral opacities. CT ab/pel nondiagnostic. Lactate elevated at 3.7. She is still in pain. Will treat for possible community acquired pneumonia with ceftriaxone, azithro. Will admit given age, comorbidities. I discussed with Dr. Sharl Ma, who accepted the patient on tele.   Results for orders placed during the hospital encounter of 12/09/11  CBC      Component Value Range   WBC 11.5 (*) 4.0 - 10.5 K/uL   RBC 3.93  3.87 - 5.11 MIL/uL   Hemoglobin 12.4  12.0 - 15.0 g/dL   HCT 16.1  09.6 - 04.5 %   MCV 92.1  78.0 - 100.0 fL   MCH 31.6  26.0 - 34.0 pg   MCHC 34.3  30.0 - 36.0 g/dL   RDW 40.9  81.1 - 91.4 %   Platelets 226  150 - 400 K/uL  COMPREHENSIVE METABOLIC PANEL      Component Value Range   Sodium 135  135 - 145 mEq/L   Potassium 4.3  3.5 - 5.1 mEq/L   Chloride 101  96 - 112 mEq/L   CO2 20  19 - 32 mEq/L   Glucose, Bld 139 (*) 70 - 99 mg/dL   BUN 23  6 - 23 mg/dL   Creatinine, Ser 7.82  0.50 - 1.10 mg/dL   Calcium 9.3  8.4 - 95.6 mg/dL   Total Protein 6.8  6.0 - 8.3 g/dL   Albumin 3.6  3.5 - 5.2 g/dL   AST 24  0 - 37 U/L   ALT 11  0 - 35 U/L   Alkaline Phosphatase 67  39 - 117 U/L   Total Bilirubin 0.3  0.3 - 1.2 mg/dL   GFR calc non Af Amer 51 (*) >90 mL/min   GFR calc Af Amer 60 (*) >90 mL/min  TROPONIN I      Component Value Range   Troponin I <0.30  <0.30 ng/mL  URINALYSIS, ROUTINE W REFLEX MICROSCOPIC      Component Value Range   Color, Urine YELLOW  YELLOW   APPearance CLOUDY (*) CLEAR   Specific Gravity, Urine 1.010  1.005 - 1.030   pH 8.0  5.0 - 8.0   Glucose, UA NEGATIVE  NEGATIVE mg/dL   Hgb urine dipstick NEGATIVE  NEGATIVE   Bilirubin Urine NEGATIVE  NEGATIVE   Ketones, ur NEGATIVE  NEGATIVE mg/dL   Protein, ur NEGATIVE  NEGATIVE mg/dL   Urobilinogen, UA 0.2  0.0 - 1.0 mg/dL   Nitrite NEGATIVE   NEGATIVE   Leukocytes, UA NEGATIVE  NEGATIVE  LACTIC ACID, PLASMA      Component Value Range   Lactic Acid, Venous 3.7 (*) 0.5 - 2.2 mmol/L  POCT I-STAT TROPONIN I      Component Value Range   Troponin i, poc 0.01  0.00 - 0.08 ng/mL   Comment 3            Ct Abdomen Pelvis W Contrast  12/09/2011  *RADIOLOGY REPORT*  Clinical Data: Chest pain and abdominal pain; vomiting.  CT ABDOMEN AND PELVIS WITH CONTRAST  Technique:  Multidetector CT imaging of the abdomen and pelvis was performed following the standard protocol during bolus administration of intravenous contrast.  Contrast: OMNIPAQUE IOHEXOL 300 MG/ML  SOLN  Comparison: Chest and abdominal radiographs performed earlier today at 01:01 a.m.  Findings: The visualized lung bases  are clear.  There is mild diffuse prominence of the intrahepatic biliary ducts; this may be within normal limits status post cholecystectomy.  The liver is otherwise unremarkable in appearance.  The spleen is normal in appearance.  The pancreas and adrenal glands are grossly unremarkable.  Nonspecific perinephric stranding is noted bilaterally.  Contrast is noted filling the renal calyces, limiting evaluation for renal stones.  There is no evidence of hydronephrosis.  No obstructing ureteral stones are identified.  Small bilateral extrarenal pelves are noted bilaterally.  No free fluid is identified.  The small bowel is unremarkable in appearance.  The stomach is within normal limits.  No acute vascular abnormalities are seen.  Mild calcification is noted along the abdominal aorta and its branches.  The appendix is not definitely seen; there is no evidence for appendicitis.  Minimal diverticulosis is noted along the sigmoid colon; the colon is otherwise unremarkable.  The bladder is moderately distended and grossly unremarkable.  The patient is status post hysterectomy.  No suspicious adnexal masses are seen.  No inguinal lymphadenopathy is seen.  No acute osseous  abnormalities are identified.  Multilevel vacuum phenomenon is noted along the lumbar spine.  IMPRESSION:  1.  No acute abnormalities seen within the abdomen or pelvis. 2.  Minimal diverticulosis along the sigmoid colon, without evidence of diverticulitis. 3.  Mild calcification along the abdominal aorta and its branches. 4.  Mild prominence of the intrahepatic biliary ducts may be within normal limits status post cholecystectomy. 5.  Mild degenerative change along the lumbar spine.   Original Report Authenticated By: Tonia Ghent, M.D.    Dg Abd Acute W/chest  12/09/2011  *RADIOLOGY REPORT*  Clinical Data: Chest pain, shortness of breath, nausea and vomiting.  ACUTE ABDOMEN SERIES (ABDOMEN 2 VIEW & CHEST 1 VIEW)  Comparison: None.  Findings: The lungs are well-aerated.  Minimal bibasilar opacities likely reflect atelectasis.  There is no evidence of pleural effusion or pneumothorax.  The cardiomediastinal silhouette is within normal limits.  The visualized bowel gas pattern is unremarkable; there is a relative paucity of bowel gas within the abdomen.  Scattered stool is seen within the colon; there is no evidence of small bowel dilatation to suggest obstruction.  No free intra-abdominal air is identified on the provided upright view.  No acute osseous abnormalities are seen; the sacroiliac joints are unremarkable in appearance.  Mild left convex lumbar scoliosis is noted.  IMPRESSION:  1.  Unremarkable bowel gas pattern; no free intra-abdominal air seen. 2.  Minimal bibasilar airspace opacities likely reflect atelectasis; lungs otherwise clear.   Original Report Authenticated By: Tonia Ghent, M.D.       Richardean Canal, MD 12/09/11 [phone]

## 2011-12-09 NOTE — Consult Note (Signed)
[facility] Consultation  Referring Provider: [facility] Primary Care Physician:  Hoyle Sauer, MD Primary Gastroenterologist:   None (?) Reason for Consultation:  vomiting  HPI: Savannah [name] is a 78 y.o. female admitted to the hospital this morning with a horrible headache. Patient cannot provide history, she has Alzheimer's dementia and is confused. In fact, patient denied any vomiting today. She apparently vomited twice in the emergency department after receiving morphine in the ambulance. Her nurse, she vomited once this morning after another dose of pain medication. Her head CT is negative for acute abnormalities. CT scan of the abdomen shows mild intrahepatic ductal dilation post cholecystectomy, no acute abnormalities. Labs were unrevealing except for WBC 11.5.  Lipase and LFTs are normal.   Past Medical History  Diagnosis Date  . Carotid artery disease     Doppler,  April, 2013, 0-39% R. ICA,  60-79% LICA.  Some progression on the left, plan followup 6 months  . Chest pain     Nuclear, November, 2010, no ischemia, normal ejection fraction  . Ejection fraction     EF normal, nuclear, 2010  . GERD (gastroesophageal reflux disease)   . Cough     May be related to GERD  . Alzheimer's dementia   . Diabetes mellitus     insulin dependent  . Anxiety     Past Surgical History  Procedure Date  . Cholecystectomy     Prior to Admission medications   Medication Sig Start Date End Date Taking? Authorizing Provider  divalproex (DEPAKOTE ER) 250 MG 24 hr tablet Take 500 mg by mouth daily.   Yes Historical Provider, MD  estradiol (ESTRACE) 1 MG tablet Take 1 mg by mouth daily.   Yes Historical Provider, MD  furosemide (LASIX) 20 MG tablet Take 20 mg by mouth daily.   Yes Historical Provider, MD  insulin glargine (LANTUS) 100 UNIT/ML injection Inject 35 Units into the skin daily.   Yes Historical Provider, MD  insulin lispro (HUMALOG) 100 UNIT/ML injection  Inject 5 Units into the skin 2 (two) times daily.   Yes Historical Provider, MD  Memantine HCl ER (NAMENDA XR) 28 MG CP24 Take 28 mg by mouth daily.   Yes Historical Provider, MD  metFORMIN (GLUCOPHAGE) 1000 MG tablet Take 1,000 mg by mouth 2 (two) times daily with a meal.   Yes Historical Provider, MD  metoprolol succinate (TOPROL-XL) 50 MG 24 hr tablet Take 50 mg by mouth daily. Take with or immediately following a meal.   Yes Historical Provider, MD  pravastatin (PRAVACHOL) 40 MG tablet Take 40 mg by mouth daily.  05/13/11  Yes Historical Provider, MD  Rivastigmine (EXELON) 13.3 MG/24HR PT24 Place 13.3 mg onto the skin daily.   Yes Historical Provider, MD  sertraline (ZOLOFT) 100 MG tablet Take 100 mg by mouth daily.   Yes Historical Provider, MD  B-D ULTRAFINE III SHORT PEN 31G X 8 MM MISC  03/12/11   Historical Provider, MD    Current Facility-Administered Medications  Medication Dose Route Frequency Provider Last Rate Last Dose  . 0.9 %  sodium chloride infusion   Intravenous Continuous Meredeth Ide, MD 10 mL/hr at 12/09/11 1300    . [COMPLETED] azithromycin (ZITHROMAX) 500 mg in dextrose 5 % 250 mL IVPB  500 mg Intravenous Once Richardean Canal, MD   500 mg at 12/09/11 1022  . azithromycin (ZITHROMAX) 500 mg in dextrose 5 % 250 mL IVPB  500 mg Intravenous Q24H Meredeth Ide, MD      . [  COMPLETED] cefTRIAXone (ROCEPHIN) 1 g in dextrose 5 % 50 mL IVPB  1 g Intravenous Once Richardean Canal, MD   1 g at 12/09/11 0734  . cefTRIAXone (ROCEPHIN) 1 g in dextrose 5 % 50 mL IVPB  1 g Intravenous Q24H Drusilla Kanner, PHARMD      . divalproex (DEPAKOTE ER) 24 hr tablet 500 mg  500 mg Oral Daily Meredeth Ide, MD   500 mg at 12/09/11 1502  . furosemide (LASIX) tablet 20 mg  20 mg Oral Daily Meredeth Ide, MD   20 mg at 12/09/11 1431  . hydrALAZINE (APRESOLINE) tablet 25 mg  25 mg Oral Q6H PRN Meredeth Ide, MD      . HYDROcodone-acetaminophen (NORCO/VICODIN) 5-325 MG per tablet 1-2 tablet  1-2 tablet Oral Q4H  PRN Meredeth Ide, MD   1 tablet at 12/09/11 1437  . HYDROmorphone (DILAUDID) injection 1 mg  1 mg Intravenous Q4H PRN Richardean Canal, MD      . insulin aspart (novoLOG) injection 0-9 Units  0-9 Units Subcutaneous TID WC Meredeth Ide, MD      . insulin glargine (LANTUS) injection 35 Units  35 Units Subcutaneous Daily Meredeth Ide, MD   35 Units at 12/09/11 1149  . [COMPLETED] iohexol (OMNIPAQUE) 300 MG/ML solution 100 mL  100 mL Intravenous Once PRN Medication Radiologist, MD   100 mL at 12/09/11 0625  . [COMPLETED] iohexol (OMNIPAQUE) 300 MG/ML solution 20 mL  20 mL Oral Q1 Hr x 2 Medication Radiologist, MD   20 mL at 12/09/11 0532  . lisinopril (PRINIVIL,ZESTRIL) tablet 5 mg  5 mg Oral Daily Meredeth Ide, MD      . LORazepam (ATIVAN) tablet 1 mg  1 mg Oral Q4H PRN Meredeth Ide, MD   1 mg at 12/09/11 1030  . memantine (NAMENDA) tablet 10 mg  10 mg Oral BID Meredeth Ide, MD   10 mg at 12/09/11 1431  . metoprolol succinate (TOPROL-XL) 24 hr tablet 50 mg  50 mg Oral Daily Meredeth Ide, MD   50 mg at 12/09/11 1431  . [COMPLETED] morphine 4 MG/ML injection 4 mg  4 mg Intravenous Once Richardean Canal, MD   4 mg at 12/09/11 0732  . [COMPLETED] ondansetron (ZOFRAN) injection 4 mg  4 mg Intravenous Once Sunnie Nielsen, MD   4 mg at 12/09/11 0057  . ondansetron (ZOFRAN) injection 4 mg  4 mg Intravenous Q8H PRN Richardean Canal, MD      . ondansetron [facility]) tablet 4 mg  4 mg Oral Q6H PRN Meredeth Ide, MD       Or  . ondansetron (ZOFRAN) injection 4 mg  4 mg Intravenous Q6H PRN Meredeth Ide, MD   4 mg at 12/09/11 1454  . pneumococcal 23 valent vaccine (PNU-IMMUNE) injection 0.5 mL  0.5 mL Intramuscular Tomorrow-1000 Meredeth Ide, MD      . rivastigmine (EXELON) 14.1 mg  14.1 mg Transdermal Daily Meredeth Ide, MD   14.1 mg at 12/09/11 1503  . sertraline (ZOLOFT) tablet 100 mg  100 mg Oral Daily Meredeth Ide, MD   100 mg at 12/09/11 1431  . [COMPLETED] sodium chloride 0.9 % bolus 1,000 mL  1,000 mL Intravenous Once Sunnie Nielsen, MD   1,000 mL at 12/09/11 0532  . [DISCONTINUED] 0.9 %  sodium chloride infusion   Intravenous Continuous Meredeth Ide, MD   10 mL/hr at 12/09/11  1201  . [DISCONTINUED] lisinopril (PRINIVIL,ZESTRIL) tablet 10 mg  10 mg Oral Daily Meredeth Ide, MD   10 mg at 12/09/11 1149  . [DISCONTINUED] Memantine HCl ER CP24 28 mg  28 mg Oral Daily Meredeth Ide, MD      . [DISCONTINUED] ondansetron [facility]) injection 4 mg  4 mg Intravenous Q6H PRN Meredeth Ide, MD   4 mg at 12/09/11 0807  . [DISCONTINUED] rivastigmine (EXELON) 9.5 mg/24hr 9.5 mg  9.5 mg Transdermal Daily Meredeth Ide, MD      . [DISCONTINUED] Rivastigmine PT24 13.3 mg  13.3 mg Transdermal Daily Meredeth Ide, MD        Allergies as of 12/09/2011  . (No Known Allergies)    History reviewed. No pertinent family history.  History   Social History  . Marital Status: Married    Spouse Name: N/A    Number of Children: N/A  . Years of Education: N/A   Occupational History  . Not on file.   Social History Main Topics  . Smoking status: Former Smoker    Quit date: 12/09/1971  . Smokeless tobacco: Never Used  . Alcohol Use: No  . Drug Use: No  . Sexually Active: No    Review of Systems: Unobtainable secondary to patient being confused  PHYSICAL EXAM: Vital signs in last 24 hours: Temp:  [97 F (36.1 C)-97.8 F (36.6 C)] 97.1 F (36.2 C) (11/07 1500) Pulse Rate:  [39-72] 70  (11/07 1500) Resp:  [12-32] 20  (11/07 1500) BP: (119-181)/(47-139) 120/64 mmHg (11/07 1500) SpO2:  [97 %-100 %] 97 % (11/07 1500) Weight:  [209 lb 1.6 oz (94.847 kg)] 209 lb 1.6 oz (94.847 kg) (11/07 0955)   General:   Pleasantly confused white female in NAD Head:  Normocephalic and atraumatic. Eyes:   No icterus.   Conjunctiva pink. Ears:  Normal auditory acuity. Neck:  Supple; no masses felt Lungs:  Respirations even and unlabored. Lungs clear to auscultation bilaterally.   No wheezes, crackles, or rhonchi.  Heart:  Regular rate and  rhythm Abdomen:  Soft, nondistended, nontender. Normal bowel sounds. No appreciable masses or hepatomegaly.  Rectal:  Not performed.  Msk:  Symmetrical without gross deformities.  Extremities:  Without edema. Neurologic:  Alert, not oriented to place or time  Skin:  Intact without significant lesions or rashes. Cervical Nodes:  No significant cervical adenopathy. Psych:  Alert and cooperative. Normal affect.  LAB RESULTS:  Basename 12/09/11 0110  WBC 11.5*  HGB 12.4  HCT 36.2  PLT 226   BMET  Basename 12/09/11 0110  NA 135  K 4.3  CL 101  CO2 20  GLUCOSE 139*  BUN 23  CREATININE 1.03  CALCIUM 9.3   LFT  Basename 12/09/11 0110  PROT 6.8  ALBUMIN 3.6  AST 24  ALT 11  ALKPHOS 67  BILITOT 0.3  BILIDIR --  IBILI --    STUDIES: Ct Head Wo Contrast  12/09/2011  *RADIOLOGY REPORT*  Clinical Data: Headache, vomiting  CT HEAD WITHOUT CONTRAST  Technique:  Contiguous axial images were obtained from the base of the skull through the vertex without contrast.  Comparison: Brain MRI 11/20/2003.  Findings: No skull fracture is noted.  No intracranial hemorrhage, mass effect or midline shift.  Paranasal sinuses and mastoid air cells are unremarkable.  No acute infarction.  Ventricular size is stable from prior exam. No intra or extra-axial fluid collection.  Stable mild cerebral atrophy.  There is mild periventricular  white matter decreased attenuation probable due to chronic small vessel ischemic changes.  IMPRESSION: No acute intracranial abnormality.  Mild cerebral atrophy.  Mild periventricular white matter decreased attenuation probable due to chronic small vessel ischemic changes.   Original Report Authenticated By: Natasha Mead, M.D.    Ct Abdomen Pelvis W Contrast  12/09/2011  *RADIOLOGY REPORT*  Clinical Data: Chest pain and abdominal pain; vomiting.  CT ABDOMEN AND PELVIS WITH CONTRAST  Technique:  Multidetector CT imaging of the abdomen and pelvis was performed following the  standard protocol during bolus administration of intravenous contrast.  Contrast: OMNIPAQUE IOHEXOL 300 MG/ML  SOLN  Comparison: Chest and abdominal radiographs performed earlier today at 01:01 a.m.  Findings: The visualized lung bases are clear.  There is mild diffuse prominence of the intrahepatic biliary ducts; this may be within normal limits status post cholecystectomy.  The liver is otherwise unremarkable in appearance.  The spleen is normal in appearance.  The pancreas and adrenal glands are grossly unremarkable.  Nonspecific perinephric stranding is noted bilaterally.  Contrast is noted filling the renal calyces, limiting evaluation for renal stones.  There is no evidence of hydronephrosis.  No obstructing ureteral stones are identified.  Small bilateral extrarenal pelves are noted bilaterally.  No free fluid is identified.  The small bowel is unremarkable in appearance.  The stomach is within normal limits.  No acute vascular abnormalities are seen.  Mild calcification is noted along the abdominal aorta and its branches.  The appendix is not definitely seen; there is no evidence for appendicitis.  Minimal diverticulosis is noted along the sigmoid colon; the colon is otherwise unremarkable.  The bladder is moderately distended and grossly unremarkable.  The patient is status post hysterectomy.  No suspicious adnexal masses are seen.  No inguinal lymphadenopathy is seen.  No acute osseous abnormalities are identified.  Multilevel vacuum phenomenon is noted along the lumbar spine.  IMPRESSION:  1.  No acute abnormalities seen within the abdomen or pelvis. 2.  Minimal diverticulosis along the sigmoid colon, without evidence of diverticulitis. 3.  Mild calcification along the abdominal aorta and its branches. 4.  Mild prominence of the intrahepatic biliary ducts may be within normal limits status post cholecystectomy. 5.  Mild degenerative change along the lumbar spine.   Original Report Authenticated By:  Tonia Ghent, M.D.    Dg Abd Acute W/chest  12/09/2011  *RADIOLOGY REPORT*  Clinical Data: Chest pain, shortness of breath, nausea and vomiting.  ACUTE ABDOMEN SERIES (ABDOMEN 2 VIEW & CHEST 1 VIEW)  Comparison: None.  Findings: The lungs are well-aerated.  Minimal bibasilar opacities likely reflect atelectasis.  There is no evidence of pleural effusion or pneumothorax.  The cardiomediastinal silhouette is within normal limits.  The visualized bowel gas pattern is unremarkable; there is a relative paucity of bowel gas within the abdomen.  Scattered stool is seen within the colon; there is no evidence of small bowel dilatation to suggest obstruction.  No free intra-abdominal air is identified on the provided upright view.  No acute osseous abnormalities are seen; the sacroiliac joints are unremarkable in appearance.  Mild left convex lumbar scoliosis is noted.  IMPRESSION:  1.  Unremarkable bowel gas pattern; no free intra-abdominal air seen. 2.  Minimal bibasilar airspace opacities likely reflect atelectasis; lungs otherwise clear.   Original Report Authenticated By: Tonia Ghent, M.D.      PREVIOUS ENDOSCOPIES: None as far as I can tell  IMPRESSION / PLAN: 66. 78 year old female with acute  headache. She has altered mental status but has dementia. Head CTscan negative. Workup in progress.  2. Acute nausea and vomiting. This could be related to pain medications since each episode of vomiting occurred about narcotics. Vomiting may have been secondary to severe pain but underlying pathology not excluded.  Would try sips of clear for now. Continue anti-emetics. Trial of Ultram for pain as it would be less likely to cause nausea and vomiting. If vomiting persists will need further GI workup.  3. Alzheimers dementia     Thanks   LOS: 0 days   Willette Cluster  12/09/2011, 4:35 PM

## 2011-12-09 NOTE — Progress Notes (Addendum)
ANTIBIOTIC CONSULT NOTE - INITIAL  Pharmacy Consult for ceftraixone Indication: rule out pneumonia  No Known Allergies  Patient Measurements:  wt: 93 kg   Vital Signs: Temp: 97.8 F (36.6 C) (11/07 0955) Temp src: Oral (11/07 0955) BP: 119/87 mmHg (11/07 0955) Pulse Rate: 72  (11/07 0955) Intake/Output from previous day: 11/06 0701 - 11/07 0700 In: -  Out: 200 [Urine:200] Intake/Output from this shift: Total I/O In: 2000 [I.V.:2000] Out: 6 [Urine:6]  Labs:  [facility] 12/09/11 0110  WBC 11.5*  HGB 12.4  PLT 226  LABCREA --  CREATININE 1.03   The CrCl is unknown because both a height and weight (above a minimum accepted value) are required for this calculation. No results found for this basename: VANCOTROUGH:2,VANCOPEAK:2,VANCORANDOM:2,GENTTROUGH:2,GENTPEAK:2,GENTRANDOM:2,TOBRATROUGH:2,TOBRAPEAK:2,TOBRARND:2,AMIKACINPEAK:2,AMIKACINTROU:2,AMIKACIN:2, in the last 72 hours   Microbiology: No results found for this or any previous visit (from the past 720 hour(s)).  Medical History: Past Medical History  Diagnosis Date  . Carotid artery disease     Doppler,  April, 2013, 0-39% R. ICA,  60-79% LICA.  Some progression on the left, plan followup 6 months  . Chest pain     Nuclear, November, 2010, no ischemia, normal ejection fraction  . Ejection fraction     EF normal, nuclear, 2010  . GERD (gastroesophageal reflux disease)   . Cough     May be related to GERD  . Diabetes mellitus   . Alzheimer's dementia     Medications:  Prescriptions prior to admission  Medication Sig Dispense Refill  . divalproex (DEPAKOTE ER) 250 MG 24 hr tablet Take 500 mg by mouth daily.      Marland Kitchen estradiol (ESTRACE) 1 MG tablet Take 1 mg by mouth daily.      . furosemide (LASIX) 20 MG tablet Take 20 mg by mouth daily.      . insulin glargine (LANTUS) 100 UNIT/ML injection Inject 35 Units into the skin daily.      . insulin lispro (HUMALOG) 100 UNIT/ML injection Inject 5 Units into the skin 2  (two) times daily.      . Memantine HCl ER (NAMENDA XR) 28 MG CP24 Take 28 mg by mouth daily.      . metFORMIN (GLUCOPHAGE) 1000 MG tablet Take 1,000 mg by mouth 2 (two) times daily with a meal.      . metoprolol succinate (TOPROL-XL) 50 MG 24 hr tablet Take 50 mg by mouth daily. Take with or immediately following a meal.      . pravastatin (PRAVACHOL) 40 MG tablet Take 40 mg by mouth daily.       . Rivastigmine (EXELON) 13.3 MG/24HR PT24 Place 13.3 mg onto the skin daily.      . sertraline (ZOLOFT) 100 MG tablet Take 100 mg by mouth daily.      . B-D ULTRAFINE III SHORT PEN 31G X 8 MM MISC        Assessment: 78 yo F with alzheimer's dementia admitted with elevated lactate, CXR revealing bibasilar opacities with moderately elevated WBC.  To receive empiric Rocephin and Zithromax for r/o PNA.  Received Rocephin 1g at 0730 this am.  Goal of Therapy:  Appropriate dosing  Plan:  - Rocephin 1g q24h - Follow up SCr, UOP, cultures, clinical course and adjust as clinically indicated  Joby Richart L. Illene Bolus, PharmD, BCPS Clinical Pharmacist Pager: [phone] Pharmacy: [phone] 12/09/2011 10:04 AM

## 2011-12-10 DIAGNOSIS — R112 Nausea with vomiting, unspecified: Secondary | ICD-10-CM | POA: Diagnosis present

## 2011-12-10 DIAGNOSIS — J189 Pneumonia, unspecified organism: Secondary | ICD-10-CM

## 2011-12-10 LAB — CK TOTAL AND CKMB (NOT AT ARMC)
CK, MB: 4 ng/mL (ref 0.3–4.0)
Relative Index: 2.1 (ref 0.0–2.5)

## 2011-12-10 LAB — GLUCOSE, CAPILLARY
Glucose-Capillary: 99 mg/dL (ref 70–99)
Glucose-Capillary: 90 mg/dL (ref 70–99)
Glucose-Capillary: 77 mg/dL (ref 70–99)

## 2011-12-10 LAB — COMPREHENSIVE METABOLIC PANEL
Albumin: 3.2 g/dL — ABNORMAL LOW (ref 3.5–5.2)
BUN: 22 mg/dL (ref 6–23)
Calcium: 8.6 mg/dL (ref 8.4–10.5)
Creatinine, Ser: 1.25 mg/dL — ABNORMAL HIGH (ref 0.50–1.10)
GFR calc Af Amer: 47 mL/min — ABNORMAL LOW (ref >90)
Glucose, Bld: 128 mg/dL — ABNORMAL HIGH (ref 70–99)
Total Protein: 6.3 g/dL (ref 6.0–8.3)

## 2011-12-10 LAB — CBC
HCT: 36.2 % (ref 36.0–46.0)
MCV: 92.1 fL (ref 78.0–100.0)
RBC: 3.93 MIL/uL (ref 3.87–5.11)
RDW: 13.5 % (ref 11.5–15.5)
WBC: 10.7 10*3/uL — ABNORMAL HIGH (ref 4.0–10.5)

## 2011-12-10 NOTE — Plan of Care (Signed)
Problem: Phase I Progression Outcomes Goal: Dyspnea controlled at rest (HF) Outcome: Completed/Met Date Met:  12/10/11 Pt has not had any c/o dyspnea at rest, goal met Goal: Initial discharge plan identified Outcome: Completed/Met Date Met:  12/10/11 Initial plan for d/c is to return home with husband, goal met Goal: Voiding-avoid urinary catheter unless indicated Outcome: Completed/Met Date Met:  12/10/11 Pt voiding in [facility] voiding adequate amt of urine, no need for foley, goal met  Problem: Phase II Progression Outcomes Goal: Tolerating diet Outcome: Completed/Met Date Met:  12/10/11 Pt tolerating clear liquid diet, has not had any  C/o n/v, pts diet advanced, goal met

## 2011-12-10 NOTE — Progress Notes (Signed)
Gastroenterology Progress Note  Subjective:  Says that she feels well.  Does not feel sick.  No complaints of nausea and no vomiting per nursing staff.  Wants to eat and go home.  Objective:  Vital signs in last 24 hours: Temp:  [97.1 F (36.2 C)-98 F (36.7 C)] 98 F (36.7 C) (11/08 0514) Pulse Rate:  [58-74] 58  (11/08 0514) Resp:  [18-20] 18  (11/08 0514) BP: (107-130)/(47-64) 107/63 mmHg (11/08 0514) SpO2:  [96 %-98 %] 97 % (11/08 0514) Weight:  [198 lb 3.2 oz (89.903 kg)] 198 lb 3.2 oz (89.903 kg) (11/08 0514) Last BM Date: 12/09/11 General:   Alert, Well-developed, in NAD Heart:  Regular rate and rhythm Pulm:  CTAB.  No W/R/R. Abdomen:  Soft, nontender and nondistended. Normal bowel sounds, without guarding, and without rebound.   Extremities:  Without edema. Neurologic:  Alert and  oriented;  grossly normal neurologically. Psych:  Alert and cooperative. Normal mood and affect.  Intake/Output from previous day: 11/07 0701 - 11/08 0700 In: 3391.8 [P.O.:520; I.V.:2621.8; IV Piggyback:250] Out: 856 [Urine:856] Intake/Output this shift: Total I/O In: 240 [P.O.:240] Out: 450 [Urine:450]  Lab Results:  [facility] 12/10/11 0645 12/09/11 0110  WBC 10.7* 11.5*  HGB 12.0 12.4  HCT 36.2 36.2  PLT 232 226   BMET  Basename 12/10/11 0645 12/09/11 0110  NA 137 135  K 3.5 4.3  CL 102 101  CO2 23 20  GLUCOSE 128* 139*  BUN 22 23  CREATININE 1.25* 1.03  CALCIUM 8.6 9.3   LFT  Basename 12/10/11 0645  PROT 6.3  ALBUMIN 3.2*  AST 19  ALT 10  ALKPHOS 67  BILITOT 0.3  BILIDIR --  IBILI --   Ct Head Wo Contrast  12/09/2011  *RADIOLOGY REPORT*  Clinical Data: Headache, vomiting  CT HEAD WITHOUT CONTRAST  Technique:  Contiguous axial images were obtained from the base of the skull through the vertex without contrast.  Comparison: Brain MRI 11/20/2003.  Findings: No skull fracture is noted.  No intracranial hemorrhage, mass effect or midline shift.  Paranasal  sinuses and mastoid air cells are unremarkable.  No acute infarction.  Ventricular size is stable from prior exam. No intra or extra-axial fluid collection.  Stable mild cerebral atrophy.  There is mild periventricular white matter decreased attenuation probable due to chronic small vessel ischemic changes.  IMPRESSION: No acute intracranial abnormality.  Mild cerebral atrophy.  Mild periventricular white matter decreased attenuation probable due to chronic small vessel ischemic changes.   Original Report Authenticated By: Natasha Mead, M.D.    Ct Abdomen Pelvis W Contrast  12/09/2011  *RADIOLOGY REPORT*  Clinical Data: Chest pain and abdominal pain; vomiting.  CT ABDOMEN AND PELVIS WITH CONTRAST  Technique:  Multidetector CT imaging of the abdomen and pelvis was performed following the standard protocol during bolus administration of intravenous contrast.  Contrast: OMNIPAQUE IOHEXOL 300 MG/ML  SOLN  Comparison: Chest and abdominal radiographs performed earlier today at 01:01 a.m.  Findings: The visualized lung bases are clear.  There is mild diffuse prominence of the intrahepatic biliary ducts; this may be within normal limits status post cholecystectomy.  The liver is otherwise unremarkable in appearance.  The spleen is normal in appearance.  The pancreas and adrenal glands are grossly unremarkable.  Nonspecific perinephric stranding is noted bilaterally.  Contrast is noted filling the renal calyces, limiting evaluation for renal stones.  There is no evidence of hydronephrosis.  No obstructing ureteral stones are identified.  Small  bilateral extrarenal pelves are noted bilaterally.  No free fluid is identified.  The small bowel is unremarkable in appearance.  The stomach is within normal limits.  No acute vascular abnormalities are seen.  Mild calcification is noted along the abdominal aorta and its branches.  The appendix is not definitely seen; there is no evidence for appendicitis.  Minimal  diverticulosis is noted along the sigmoid colon; the colon is otherwise unremarkable.  The bladder is moderately distended and grossly unremarkable.  The patient is status post hysterectomy.  No suspicious adnexal masses are seen.  No inguinal lymphadenopathy is seen.  No acute osseous abnormalities are identified.  Multilevel vacuum phenomenon is noted along the lumbar spine.  IMPRESSION:  1.  No acute abnormalities seen within the abdomen or pelvis. 2.  Minimal diverticulosis along the sigmoid colon, without evidence of diverticulitis. 3.  Mild calcification along the abdominal aorta and its branches. 4.  Mild prominence of the intrahepatic biliary ducts may be within normal limits status post cholecystectomy. 5.  Mild degenerative change along the lumbar spine.   Original Report Authenticated By: Tonia Ghent, M.D.    Dg Abd Acute W/chest  12/09/2011  *RADIOLOGY REPORT*  Clinical Data: Chest pain, shortness of breath, nausea and vomiting.  ACUTE ABDOMEN SERIES (ABDOMEN 2 VIEW & CHEST 1 VIEW)  Comparison: None.  Findings: The lungs are well-aerated.  Minimal bibasilar opacities likely reflect atelectasis.  There is no evidence of pleural effusion or pneumothorax.  The cardiomediastinal silhouette is within normal limits.  The visualized bowel gas pattern is unremarkable; there is a relative paucity of bowel gas within the abdomen.  Scattered stool is seen within the colon; there is no evidence of small bowel dilatation to suggest obstruction.  No free intra-abdominal air is identified on the provided upright view.  No acute osseous abnormalities are seen; the sacroiliac joints are unremarkable in appearance.  Mild left convex lumbar scoliosis is noted.  IMPRESSION:  1.  Unremarkable bowel gas pattern; no free intra-abdominal air seen. 2.  Minimal bibasilar airspace opacities likely reflect atelectasis; lungs otherwise clear.   Original Report Authenticated By: Tonia Ghent, M.D.     Assessment / Plan: 1.  Acute headache.  Evaluation by primary service.  2. Acute nausea and vomiting. This was likely related to pain medications since each episode of vomiting occurred after narcotics. Vomiting may have been secondary to severe pain but underlying pathology not excluded. Now resolved after changing pain medications to tramadol.  Will advance diet. 3. Alzheimers dementia   *Will sign off from GI standpoint.  Call if needed.    LOS: 1 day   ZEHR, JESSICA D.  12/10/2011, 2:24 PM  Pager number [phone]  Chart was reviewed and patient was examined. X-rays were reviewed.    I agree with management and plans.  No evidence for active GI issues  Barbette Hair. Arlyce Dice, M.D., [facility] Cell [phone]

## 2011-12-10 NOTE — Progress Notes (Signed)
Subjective: Patient seen, vomiting has resolved. She does appear to be unsteady on her feet. Denies headache at this time  Objective: Vital signs in last 24 hours: Temp:  [97.7 F (36.5 C)-98.7 F (37.1 C)] 98.7 F (37.1 C) (11/08 1543) Pulse Rate:  [58-74] 60  (11/08 1543) Resp:  [18-20] 18  (11/08 1543) BP: (107-130)/(47-63) 109/58 mmHg (11/08 1543) SpO2:  [96 %-98 %] 96 % (11/08 1543) Weight:  [89.903 kg (198 lb 3.2 oz)] 89.903 kg (198 lb 3.2 oz) (11/08 0514) Weight change:  Last BM Date: 12/09/11  Consults: Gastroenterology  Procedures: None  Intake/Output from previous day: 11/07 0701 - 11/08 0700 In: 3391.8 [P.O.:520; I.V.:2621.8; IV Piggyback:250] Out: 856 [Urine:856] Total I/O In: 360 [P.O.:360] Out: 450 [Urine:450]   Physical Exam: Head: Normocephalic, atraumatic.  Eyes: No signs of jaundice, EOMI Nose: Mucous membranes dry.  Neck: supple,No deformities, masses, or tenderness noted. Lungs: Normal respiratory effort. B/L Clear to auscultation, no crackles or wheezes.  Heart: Regular RR. S1 and S2 normal  Abdomen: BS normoactive. Soft, Nondistended, non-tender.  Extremities: No pretibial edema, no erythema   Lab Results: Basic Metabolic Panel:  Basename 12/10/11 0645 12/09/11 0110  NA 137 135  K 3.5 4.3  CL 102 101  CO2 23 20  GLUCOSE 128* 139*  BUN 22 23  CREATININE 1.25* 1.03  CALCIUM 8.6 9.3  MG -- --  PHOS -- --   Liver Function Tests:  [facility] 12/10/11 0645 12/09/11 0110  AST 19 24  ALT 10 11  ALKPHOS 67 67  BILITOT 0.3 0.3  PROT 6.3 6.8  ALBUMIN 3.2* 3.6    Basename 12/09/11 0110  LIPASE 46  AMYLASE --   No results found for this basename: AMMONIA:2 in the last 72 hours CBC:  Basename 12/10/11 0645 12/09/11 0110  WBC 10.7* 11.5*  NEUTROABS -- --  HGB 12.0 12.4  HCT 36.2 36.2  MCV 92.1 92.1  PLT 232 226   Cardiac Enzymes:  Basename 12/10/11 0645 12/09/11 2114 12/09/11 1521 12/09/11 0951  CKTOTAL 191* -- -- --  CKMB  4.0 -- -- --  CKMBINDEX -- -- -- --  TROPONINI -- <0.30 <0.30 <0.30   BNP: No results found for this basename: PROBNP:3 in the last 72 hours D-Dimer: No results found for this basename: DDIMER:2 in the last 72 hours CBG:  Basename 12/10/11 1116 12/10/11 0546 12/09/11 2127 12/09/11 1602 12/09/11 1127  GLUCAP 90 83 99 117* 183*   Hemoglobin A1C:  Basename 12/09/11 0952  HGBA1C 5.9*   Fasting Lipid Panel: No results found for this basename: CHOL,HDL,LDLCALC,TRIG,CHOLHDL,LDLDIRECT in the last 72 hours Thyroid Function Tests: No results found for this basename: TSH,T4TOTAL,FREET4,T3FREE,THYROIDAB in the last 72 hours Anemia Panel: No results found for this basename: VITAMINB12,FOLATE,FERRITIN,TIBC,IRON,RETICCTPCT in the last 72 hours Coagulation: No results found for this basename: LABPROT:2,INR:2 in the last 72 hours Urine Drug Screen: Drugs of Abuse  No results found for this basename: labopia, cocainscrnur, labbenz, amphetmu, thcu, labbarb    Alcohol Level: No results found for this basename: ETH:2 in the last 72 hours Urinalysis:  Basename 12/09/11 0054  COLORURINE YELLOW  LABSPEC 1.010  PHURINE 8.0  GLUCOSEU NEGATIVE  HGBUR NEGATIVE  BILIRUBINUR NEGATIVE  KETONESUR NEGATIVE  PROTEINUR NEGATIVE  UROBILINOGEN 0.2  NITRITE NEGATIVE  LEUKOCYTESUR NEGATIVE   Misc. Labs:  No results found for this or any previous visit (from the past 240 hour(s)).  Studies/Results: Ct Head Wo Contrast  12/09/2011  *RADIOLOGY REPORT*  Clinical Data: Headache, vomiting  CT HEAD WITHOUT CONTRAST  Technique:  Contiguous axial images were obtained from the base of the skull through the vertex without contrast.  Comparison: Brain MRI 11/20/2003.  Findings: No skull fracture is noted.  No intracranial hemorrhage, mass effect or midline shift.  Paranasal sinuses and mastoid air cells are unremarkable.  No acute infarction.  Ventricular size is stable from prior exam. No intra or extra-axial  fluid collection.  Stable mild cerebral atrophy.  There is mild periventricular white matter decreased attenuation probable due to chronic small vessel ischemic changes.  IMPRESSION: No acute intracranial abnormality.  Mild cerebral atrophy.  Mild periventricular white matter decreased attenuation probable due to chronic small vessel ischemic changes.   Original Report Authenticated By: Natasha Mead, M.D.    Ct Abdomen Pelvis W Contrast  12/09/2011  *RADIOLOGY REPORT*  Clinical Data: Chest pain and abdominal pain; vomiting.  CT ABDOMEN AND PELVIS WITH CONTRAST  Technique:  Multidetector CT imaging of the abdomen and pelvis was performed following the standard protocol during bolus administration of intravenous contrast.  Contrast: OMNIPAQUE IOHEXOL 300 MG/ML  SOLN  Comparison: Chest and abdominal radiographs performed earlier today at 01:01 a.m.  Findings: The visualized lung bases are clear.  There is mild diffuse prominence of the intrahepatic biliary ducts; this may be within normal limits status post cholecystectomy.  The liver is otherwise unremarkable in appearance.  The spleen is normal in appearance.  The pancreas and adrenal glands are grossly unremarkable.  Nonspecific perinephric stranding is noted bilaterally.  Contrast is noted filling the renal calyces, limiting evaluation for renal stones.  There is no evidence of hydronephrosis.  No obstructing ureteral stones are identified.  Small bilateral extrarenal pelves are noted bilaterally.  No free fluid is identified.  The small bowel is unremarkable in appearance.  The stomach is within normal limits.  No acute vascular abnormalities are seen.  Mild calcification is noted along the abdominal aorta and its branches.  The appendix is not definitely seen; there is no evidence for appendicitis.  Minimal diverticulosis is noted along the sigmoid colon; the colon is otherwise unremarkable.  The bladder is moderately distended and grossly unremarkable.   The patient is status post hysterectomy.  No suspicious adnexal masses are seen.  No inguinal lymphadenopathy is seen.  No acute osseous abnormalities are identified.  Multilevel vacuum phenomenon is noted along the lumbar spine.  IMPRESSION:  1.  No acute abnormalities seen within the abdomen or pelvis. 2.  Minimal diverticulosis along the sigmoid colon, without evidence of diverticulitis. 3.  Mild calcification along the abdominal aorta and its branches. 4.  Mild prominence of the intrahepatic biliary ducts may be within normal limits status post cholecystectomy. 5.  Mild degenerative change along the lumbar spine.   Original Report Authenticated By: Tonia Ghent, M.D.    Dg Abd Acute W/chest  12/09/2011  *RADIOLOGY REPORT*  Clinical Data: Chest pain, shortness of breath, nausea and vomiting.  ACUTE ABDOMEN SERIES (ABDOMEN 2 VIEW & CHEST 1 VIEW)  Comparison: None.  Findings: The lungs are well-aerated.  Minimal bibasilar opacities likely reflect atelectasis.  There is no evidence of pleural effusion or pneumothorax.  The cardiomediastinal silhouette is within normal limits.  The visualized bowel gas pattern is unremarkable; there is a relative paucity of bowel gas within the abdomen.  Scattered stool is seen within the colon; there is no evidence of small bowel dilatation to suggest obstruction.  No free intra-abdominal air is identified on the provided upright view.  No acute osseous abnormalities are seen; the sacroiliac joints are unremarkable in appearance.  Mild left convex lumbar scoliosis is noted.  IMPRESSION:  1.  Unremarkable bowel gas pattern; no free intra-abdominal air seen. 2.  Minimal bibasilar airspace opacities likely reflect atelectasis; lungs otherwise clear.   Original Report Authenticated By: Tonia Ghent, M.D.     Medications: Scheduled Meds:   . azithromycin  500 mg Intravenous Q24H  . cefTRIAXone (ROCEPHIN)  IV  1 g Intravenous Q24H  . divalproex  500 mg Oral Daily  . insulin  aspart  0-9 Units Subcutaneous TID WC  . insulin glargine  35 Units Subcutaneous Daily  . lisinopril  5 mg Oral Daily  . memantine  10 mg Oral BID  . metoprolol succinate  50 mg Oral Daily  . [COMPLETED] pneumococcal 23 valent vaccine  0.5 mL Intramuscular Tomorrow-1000  . rivastigmine  14.1 mg Transdermal Daily  . sertraline  100 mg Oral Daily  . [DISCONTINUED] furosemide  20 mg Oral Daily   Continuous Infusions:   . sodium chloride 10 mL/hr at 12/09/11 1300   PRN Meds:.hydrALAZINE, HYDROcodone-acetaminophen, [EXPIRED]  HYDROmorphone (DILAUDID) injection, LORazepam, [EXPIRED] ondansetron (ZOFRAN) IV, ondansetron (ZOFRAN) IV, ondansetron, traMADol    Active Problems:  Headache  Nausea with vomiting  Diffuse myalgias/headache Patient has signs and symptoms of viral infection. At this time she seems to be improving. Denies any headache at this time. WBC has been normal  Vomiting Could be secondary to viral gastroenteritis versus morphine side effect. Patient has not had any vomiting today and has been tolerating liquid diet. Her diet has been advanced by GI  Unsteady gait Will obtain physical therapy evaluation to see if patient would benefit from rehabilitation or home PT OT.  Questionable pneumonia Patient has been on Rocephin and Zithromax We'll continue for now  Diabetes mellitus Continue Lantus and sliding scale insulin  Hypertension Continue metoprolol , will DC the lisinopril as blood pressure was well controlled with metoprolol.    LOS: 1 day  Assessment/Plan: [facility] Pager: [phone] 12/10/2011, 3:49 PM

## 2011-12-11 LAB — GLUCOSE, CAPILLARY
Glucose-Capillary: 71 mg/dL (ref 70–99)
Glucose-Capillary: 97 mg/dL (ref 70–99)

## 2011-12-11 MED ORDER — HYDROCODONE-ACETAMINOPHEN 5-500 MG PO TABS: 1.0000 | ORAL_TABLET | Freq: Four times a day (QID) | ORAL | Status: DC | PRN

## 2011-12-11 MED ORDER — LEVOFLOXACIN 750 MG PO TABS: 750.0000 mg | ORAL_TABLET | Freq: Every day | ORAL | Status: DC

## 2011-12-11 NOTE — Progress Notes (Signed)
PT evaluation  12/10/11 1527  PT Visit Information  Last PT Received On 12/10/11  Assistance Needed +1  PT Time Calculation  PT Start Time 1518  PT Stop Time 1527  PT Time Calculation (min) 9 min  Subjective Data  Subjective Pt reports that she feels woozy and weak.    Patient Stated Goal to go home with her husband and daughter  Precautions  Precautions Fall  Home Living  Lives With Spouse  Available Help at Discharge Family;Available 24 hours/day  Additional Comments unable to get accurate history due to alzheimer's dementia  Communication  Communication No difficulties  Cognition  Overall Cognitive Status History of cognitive impairments - at baseline  Right Lower Extremity Assessment  RLE ROM/Strength/Tone Deficits  RLE ROM/Strength/Tone Deficits grossly 3/5 per functional assessment  Left Lower Extremity Assessment  LLE ROM/Strength/Tone Deficits  LLE ROM/Strength/Tone Deficits grossly 3/5 per functional assessment.    Bed Mobility  Bed Mobility Supine to Sit;Sitting - Scoot to [place] of Bed;Sit to Supine  Supine to Sit 4: Min assist;With rails;HOB elevated  Sitting - Scoot to [place] of Bed 4: Min assist;With rail  Sit to Supine 4: Min assist;With rail;HOB flat  Details for Bed Mobility Assistance min assist to boost trunk up to sitting, min assist to help pt to lift legs into the bed, min assist to weight shfit hips to get to EOB  Transfers  Transfers Sit to Stand;Stand to Sit  Sit to Stand 3: Mod assist;With upper extremity assist;From bed;From toilet;With armrests  Stand to Sit 3: Mod assist;With upper extremity assist;To bed;To toilet  Details for Transfer Assistance mod assist to stand especially from lower toliet surface, mod assist to help control descent to sit and guide pt's bottom to sitting surface.    Ambulation/Gait  Ambulation/Gait Assistance 3: Mod assist  Ambulation Distance (Feet) 15 Feet (x2)  Assistive device 1 person hand held assist  Ambulation/Gait  Assistance Details staggering gait pattern with pt with decreased awareness of imbalance and deficits  Gait Pattern Step-through pattern;Shuffle (staggering)  Gait velocity less than 1.8 ft/sec which indicates risk for recurrent falls  Balance  Balance Assessed Yes  Static Sitting Balance  Static Sitting - Balance Support Right upper extremity supported;Left upper extremity supported;Feet supported  Static Sitting - Level of Assistance 5: Stand by assistance  Dynamic Standing Balance  Dynamic Standing - Balance Support No upper extremity supported  Dynamic Standing - Level of Assistance 3: Mod assist  Dynamic Standing - Comments washing hands needed mod assist to prevent posterior LOB with both hands unsupported.    PT - End of Session  Activity Tolerance Patient limited by fatigue  Patient left in bed;with call bell/phone within reach;with bed alarm set  PT Assessment  Clinical Impression Statement 78 yr old female who was brought to the hospital from home after she experienced worst headache, which woke her up around 11 pm. Patient has alzheimer's dementia, and is a poor historian. History obtained from the husband and daughter at the bedside. Patient also complains of diffuse generalized body aches. She received one dose of morphine in the ambulance for pain, and vomited twice in the ED. Patient had CT abdomen/pelvis which was normal, Chest xray shows bibasilar infiltrates, ? Atelectasis. Patient has elevated lactate and mild elevation of wbc.  The patient presents today with great difficulty walking without extenal assist, decreased balance, and bil leg weakness.  She is currently at significant risk of falls without external assist for support.    PT  Recommendation/Assessment Patient needs continued PT services  PT Problem List Decreased strength;Decreased activity tolerance;Decreased balance;Decreased mobility;Decreased coordination;Decreased cognition;Decreased knowledge of use of  DME;Decreased safety awareness;Obesity  PT Therapy Diagnosis  Difficulty walking;Abnormality of gait;Generalized weakness;Altered mental status  PT Plan  PT Frequency Min 3X/week  PT Treatment/Interventions DME instruction;Gait training;Stair training;Functional mobility training;Therapeutic activities;Therapeutic exercise;Balance training;Neuromuscular re-education;Cognitive remediation;Patient/family education  PT Recommendation  Follow Up Recommendations Home health PT;Supervision/Assistance - 24 hour;Other (comment) (24 hour moderate level of assist)  Equipment Recommended Rolling walker with 5" wheels  Individuals Consulted  Consulted and Agree with Results and Recommendations Patient unable/family or caregiver not available  Acute Rehab PT Goals  PT Goal Formulation Patient unable to participate in goal setting  Time For Goal Achievement 12/24/11  Potential to Achieve Goals Good  Pt will go Supine/Side to Sit with modified independence  PT Goal: Supine/Side to Sit - Progress Goal set today  Pt will go Sit to Supine/Side with modified independence  PT Goal: Sit to Supine/Side - Progress Goal set today  Pt will go Sit to Stand with supervision  PT Goal: Sit to Stand - Progress Goal set today  Pt will go Stand to Sit with supervision  PT Goal: Stand to Sit - Progress Goal set today  Pt will Ambulate 51 - 150 feet;with min assist;with rolling walker  PT Goal: Ambulate - Progress Goal set today  Pt will Go Up / Down Stairs 3-5 stairs;with least restrictive assistive device;with min assist  PT Goal: Up/Down Stairs - Progress Goal set today  PT G-Codes **NOT FOR INPATIENT CLASS**  Functional Assessment Tool Used gait speed  Functional Limitation Mobility: Walking and moving around  Mobility: Walking and Moving Around Current Status (Z6109) CK  Mobility: Walking and Moving Around Goal Status (U0454) CJ  PT General Charges  $$ ACUTE PT VISIT 1 Procedure  PT Evaluation  $Initial PT  Evaluation Tier I 1 Procedure  PT Treatments  $Therapeutic Activity 8-22 mins   Evaluation done by Andrey Campanile, PT. Note created by Wandra Arthurs, PT

## 2011-12-11 NOTE — Progress Notes (Signed)
   CARE MANAGEMENT NOTE 12/11/2011  Patient:  Savannah [name], Savannah [name]   Account Number:  [id number]  Date Initiated:  12/11/2011  Documentation initiated by:  [facility]  Subjective/Objective Assessment:   Headache, N/V     Action/Plan:   lives at home with husband   Anticipated DC Date:  12/11/2011   Anticipated DC Plan:  HOME W HOME HEALTH SERVICES      DC Planning Services  CM consult      [facility] Choice  HOME HEALTH   Choice offered to / List presented to:  C-1 Patient   DME arranged  [facility] Hurst      DME agency  [facility]     HH arranged  HH-2 PT  HH-3 OT      [facility] agency  [facility]   Status of service:  Completed, signed off Medicare Important Message given?   (If response is "NO", the following Medicare IM given date fields will be blank) Date Medicare IM given:   Date Additional Medicare IM given:    Discharge Disposition:  HOME W HOME HEALTH SERVICES  Per UR Regulation:    If discussed at Long Length of Stay Meetings, dates discussed:    Comments:  12/11/2011 1145 NCM spoke to dtr, Dorna Leitz, [phone]. States her mother needs a RW for home. Offered choice for [facility] and dtr agreeable to [employer]. Explained [facility] will call and arrange appt. Notified Tamala Bari, [facility] rep. Requested orders be faxed. Faxed to [facility], rep. Provided HH contact info on d/c instructions. Isidoro Donning RN CCM Case Mgmt phone [phone]  12/11/2011 0930 NCM spoke to pt and gave permission to speak to her husband. NCM offered choice for [facility] and pt states she did not have preference for [facility]. States she had RW at home. NCM spoke to husband. States he has RW at home, but his wife does not have RW.  Isidoro Donning RN CCM Case Mgmt phone [phone]

## 2011-12-11 NOTE — Plan of Care (Signed)
Problem: Discharge Progression Outcomes Goal: Discharge plan in place and appropriate Outcome: Completed/Met Date Met:  12/11/11 Pt being d/c to home with husband, daughter assists at home, pt will be receiving home PT and walker has been ordered, all d/c instructions, meds, and follow up appointments given to daughter d/t pt has dementia and confused, family verbalized understanding, pt left via wheelchair with daughter, pt stable

## 2011-12-11 NOTE — Discharge Summary (Signed)
Physician Discharge Summary  Savannah [name] RUE:[id number] DOB: [birth year] DOA: 12/09/2011  PCP: Hoyle Sauer, MD  Admit date: 12/09/2011 Discharge date: 12/11/2011  Time spent: 45  minutes  Recommendations for Outpatient Follow-up: Home health physical therapy Patient also followup with her primary care provider in 2 weeks  Discharge Diagnoses:  Active Problems:  Headache  Nausea with vomiting   Discharge Condition:  Stable  Diet recommendation: Low salt diet  Filed Weights   12/09/11 0955 12/10/11 0514 12/11/11 0529  Weight: 94.847 kg (209 lb 1.6 oz) 89.903 kg (198 lb 3.2 oz) 89.5 kg (197 lb 5 oz)    History of present illness:  78 yr old female who was brought to the hospital from home after she experienced worst headache, which woke her up around 11 pm. Patient has alzheimer's dementia, and is a poor historian. History obtained from the husband and daughter at the bedside. Patient also complains of diffuse generalized body aches. She received one dose of morphine in the ambulance for pain, and vomited twice in the ED. Patient had CT abdomen/pelvis which was normal, Chest xray shows bibasilar infiltrates, ? Atelectasis. Patient has elevated lactate and mild elevation of wbc.She denies photophobia, no neck rigidity.   Hospital Course:   Headache/myalgias Patient had a CT scan of the head which was negative, at this time the headaches have resolved. She did not have any neck rigidity or photophobia at the time of admission. Diffuse measures are thought to be secondary to viral syndrome as patient also had vomiting which has resolved at this time. Patient should continue take Tylenol when necessary at home.  Pneumonia Patient's chest x-ray showed bibasilar infiltrates question atelectasis but she was started on IV antibiotics in the ED which was continued in the hospital. At this time I would send the patient home on Levaquin for 5 more days to complete the course of  antibiotics for questionable pneumonia.  Deconditioning Patient has unsteady gait she has history of other was dementia we were physical therapy to see the patient at this time they recommend home health PT for the patient.  Dehydration Patient was given gentle IV fluids in the hospital.  Vomiting Patient had 3 episodes of vomiting during the day of admission, GI was consulted and at this time the vomiting has resolved which was thought to be secondary to morphine patient received in the ambulance while coming to the ED    Procedures:  None  Consultations:  Gastroenterology  Discharge Exam: Filed Vitals:   12/10/11 0514 12/10/11 1543 12/10/11 2221 12/11/11 0529  BP: 107/63 109/58 131/50 164/53  Pulse: 58 60 58 62  Temp: 98 F (36.7 C) 98.7 F (37.1 C) 98.9 F (37.2 C) 98.5 F (36.9 C)  TempSrc: Oral Oral Oral Oral  Resp: 18 18 17 18   Height:      Weight: 89.903 kg (198 lb 3.2 oz)   89.5 kg (197 lb 5 oz)  SpO2: 97% 96% 99% 100%    General: Patient appears in no acute distress Cardiovascular: S1-S2 is regular in rate and rhythm Respiratory: Clear to auscultation bilaterally Extremities: no clubbing no edema  Discharge Instructions  Discharge Orders    Future Appointments: Provider: Department: Dept Phone: Center:   12/14/2011 11:00 AM [facility] [phone] None     Future Orders Please Complete By Expires   Diet - low sodium heart healthy      Increase activity slowly          Medication  List     As of 12/11/2011  8:48 AM    TAKE these medications         B-D ULTRAFINE III SHORT PEN 31G X 8 MM Misc   Generic drug: Insulin Pen Needle      divalproex 250 MG 24 hr tablet   Commonly known as: DEPAKOTE ER   Take 500 mg by mouth daily.      estradiol 1 MG tablet   Commonly known as: ESTRACE   Take 1 mg by mouth daily.      EXELON 13.3 MG/24HR Pt24   Generic drug: Rivastigmine   Place 13.3 mg onto the skin daily.      furosemide 20 MG  tablet   Commonly known as: LASIX   Take 20 mg by mouth daily.      insulin glargine 100 UNIT/ML injection   Commonly known as: LANTUS   Inject 35 Units into the skin daily.      insulin lispro 100 UNIT/ML injection   Commonly known as: HUMALOG   Inject 5 Units into the skin 2 (two) times daily.      levofloxacin 750 MG tablet   Commonly known as: LEVAQUIN   Take 1 tablet (750 mg total) by mouth daily.      metFORMIN 1000 MG tablet   Commonly known as: GLUCOPHAGE   Take 1,000 mg by mouth 2 (two) times daily with a meal.      metoprolol succinate 50 MG 24 hr tablet   Commonly known as: TOPROL-XL   Take 50 mg by mouth daily. Take with or immediately following a meal.      NAMENDA XR 28 MG Cp24   Generic drug: Memantine HCl ER   Take 28 mg by mouth daily.      pravastatin 40 MG tablet   Commonly known as: PRAVACHOL   Take 40 mg by mouth daily.      sertraline 100 MG tablet   Commonly known as: ZOLOFT   Take 100 mg by mouth daily.          The results of significant diagnostics from this hospitalization (including imaging, microbiology, ancillary and laboratory) are listed below for reference.    Significant Diagnostic Studies: Ct Head Wo Contrast  12/09/2011  *RADIOLOGY REPORT*  Clinical Data: Headache, vomiting  CT HEAD WITHOUT CONTRAST  Technique:  Contiguous axial images were obtained from the base of the skull through the vertex without contrast.  Comparison: Brain MRI 11/20/2003.  Findings: No skull fracture is noted.  No intracranial hemorrhage, mass effect or midline shift.  Paranasal sinuses and mastoid air cells are unremarkable.  No acute infarction.  Ventricular size is stable from prior exam. No intra or extra-axial fluid collection.  Stable mild cerebral atrophy.  There is mild periventricular white matter decreased attenuation probable due to chronic small vessel ischemic changes.  IMPRESSION: No acute intracranial abnormality.  Mild cerebral atrophy.  Mild  periventricular white matter decreased attenuation probable due to chronic small vessel ischemic changes.   Original Report Authenticated By: Natasha Mead, M.D.    Ct Abdomen Pelvis W Contrast  12/09/2011  *RADIOLOGY REPORT*  Clinical Data: Chest pain and abdominal pain; vomiting.  CT ABDOMEN AND PELVIS WITH CONTRAST  Technique:  Multidetector CT imaging of the abdomen and pelvis was performed following the standard protocol during bolus administration of intravenous contrast.  Contrast: OMNIPAQUE IOHEXOL 300 MG/ML  SOLN  Comparison: Chest and abdominal radiographs performed earlier today at 01:01 a.m.  Findings: The visualized lung bases are clear.  There is mild diffuse prominence of the intrahepatic biliary ducts; this may be within normal limits status post cholecystectomy.  The liver is otherwise unremarkable in appearance.  The spleen is normal in appearance.  The pancreas and adrenal glands are grossly unremarkable.  Nonspecific perinephric stranding is noted bilaterally.  Contrast is noted filling the renal calyces, limiting evaluation for renal stones.  There is no evidence of hydronephrosis.  No obstructing ureteral stones are identified.  Small bilateral extrarenal pelves are noted bilaterally.  No free fluid is identified.  The small bowel is unremarkable in appearance.  The stomach is within normal limits.  No acute vascular abnormalities are seen.  Mild calcification is noted along the abdominal aorta and its branches.  The appendix is not definitely seen; there is no evidence for appendicitis.  Minimal diverticulosis is noted along the sigmoid colon; the colon is otherwise unremarkable.  The bladder is moderately distended and grossly unremarkable.  The patient is status post hysterectomy.  No suspicious adnexal masses are seen.  No inguinal lymphadenopathy is seen.  No acute osseous abnormalities are identified.  Multilevel vacuum phenomenon is noted along the lumbar spine.  IMPRESSION:  1.  No  acute abnormalities seen within the abdomen or pelvis. 2.  Minimal diverticulosis along the sigmoid colon, without evidence of diverticulitis. 3.  Mild calcification along the abdominal aorta and its branches. 4.  Mild prominence of the intrahepatic biliary ducts may be within normal limits status post cholecystectomy. 5.  Mild degenerative change along the lumbar spine.   Original Report Authenticated By: Tonia Ghent, M.D.    Dg Abd Acute W/chest  12/09/2011  *RADIOLOGY REPORT*  Clinical Data: Chest pain, shortness of breath, nausea and vomiting.  ACUTE ABDOMEN SERIES (ABDOMEN 2 VIEW & CHEST 1 VIEW)  Comparison: None.  Findings: The lungs are well-aerated.  Minimal bibasilar opacities likely reflect atelectasis.  There is no evidence of pleural effusion or pneumothorax.  The cardiomediastinal silhouette is within normal limits.  The visualized bowel gas pattern is unremarkable; there is a relative paucity of bowel gas within the abdomen.  Scattered stool is seen within the colon; there is no evidence of small bowel dilatation to suggest obstruction.  No free intra-abdominal air is identified on the provided upright view.  No acute osseous abnormalities are seen; the sacroiliac joints are unremarkable in appearance.  Mild left convex lumbar scoliosis is noted.  IMPRESSION:  1.  Unremarkable bowel gas pattern; no free intra-abdominal air seen. 2.  Minimal bibasilar airspace opacities likely reflect atelectasis; lungs otherwise clear.   Original Report Authenticated By: Tonia Ghent, M.D.     Microbiology: No results found for this or any previous visit (from the past 240 hour(s)).   Labs: Basic Metabolic Panel:  Lab 12/10/11 1610 12/09/11 0110  NA 137 135  K 3.5 4.3  CL 102 101  CO2 23 20  GLUCOSE 128* 139*  BUN 22 23  CREATININE 1.25* 1.03  CALCIUM 8.6 9.3  MG -- --  PHOS -- --   Liver Function Tests:  Lab 12/10/11 0645 12/09/11 0110  AST 19 24  ALT 10 11  ALKPHOS 67 67  BILITOT 0.3  0.3  PROT 6.3 6.8  ALBUMIN 3.2* 3.6    Lab 12/09/11 0110  LIPASE 46  AMYLASE --   No results found for this basename: AMMONIA:5 in the last 168 hours CBC:  Lab 12/10/11 0645 12/09/11 0110  WBC 10.7* 11.5*  NEUTROABS -- --  HGB 12.0 12.4  HCT 36.2 36.2  MCV 92.1 92.1  PLT 232 226   Cardiac Enzymes:  Lab 12/10/11 0645 12/09/11 2114 12/09/11 1521 12/09/11 0951 12/09/11 0110  CKTOTAL 191* -- -- -- --  CKMB 4.0 -- -- -- --  CKMBINDEX -- -- -- -- --  TROPONINI -- <0.30 <0.30 <0.30 <0.30   BNP: BNP (last 3 results) No results found for this basename: PROBNP:3 in the last 8760 hours CBG:  Lab 12/11/11 0559 12/10/11 2215 12/10/11 1619 12/10/11 1116 12/10/11 0546  GLUCAP 71 72 77 90 83       Signed:  Chania Kochanski S  [facility] 12/11/2011, 8:48 AM

## 2011-12-28 ENCOUNTER — Encounter (INDEPENDENT_AMBULATORY_CARE_PROVIDER_SITE_OTHER): Payer: Medicare Other

## 2011-12-28 DIAGNOSIS — I6529 Occlusion and stenosis of unspecified carotid artery: Secondary | ICD-10-CM

## 2011-12-31 ENCOUNTER — Encounter: Payer: Self-pay | Admitting: Cardiology

## 2012-03-18 ENCOUNTER — Other Ambulatory Visit: Payer: Self-pay

## 2012-03-24 ENCOUNTER — Emergency Department (HOSPITAL_COMMUNITY): Payer: Medicare Other

## 2012-03-24 ENCOUNTER — Encounter (HOSPITAL_COMMUNITY): Payer: Self-pay | Admitting: Emergency Medicine

## 2012-03-24 ENCOUNTER — Inpatient Hospital Stay (HOSPITAL_COMMUNITY)
Admission: EM | Admit: 2012-03-24 | Discharge: 2012-03-29 | DRG: 246 | Disposition: A | Discharge status: Skilled Nursing Facility | Payer: Medicare Other | Attending: Cardiology | Admitting: Cardiology

## 2012-03-24 ENCOUNTER — Encounter (HOSPITAL_COMMUNITY)
Admission: EM | Disposition: A | Discharge status: Skilled Nursing Facility | Payer: Self-pay | Source: Home / Self Care | Attending: Cardiology

## 2012-03-24 ENCOUNTER — Ambulatory Visit: Payer: Medicare Other | Admitting: Cardiology

## 2012-03-24 ENCOUNTER — Ambulatory Visit (HOSPITAL_COMMUNITY): Admit: 2012-03-24 | Payer: Self-pay | Admitting: Cardiology

## 2012-03-24 DIAGNOSIS — R079 Chest pain, unspecified: Secondary | ICD-10-CM

## 2012-03-24 DIAGNOSIS — F0280 Dementia in other diseases classified elsewhere without behavioral disturbance: Secondary | ICD-10-CM | POA: Diagnosis present

## 2012-03-24 DIAGNOSIS — F329 Major depressive disorder, single episode, unspecified: Secondary | ICD-10-CM | POA: Diagnosis present

## 2012-03-24 DIAGNOSIS — Z6832 Body mass index (BMI) 32.0-32.9, adult: Secondary | ICD-10-CM

## 2012-03-24 DIAGNOSIS — F02818 Dementia in other diseases classified elsewhere, unspecified severity, with other behavioral disturbance: Secondary | ICD-10-CM | POA: Diagnosis present

## 2012-03-24 DIAGNOSIS — I214 Non-ST elevation (NSTEMI) myocardial infarction: Secondary | ICD-10-CM

## 2012-03-24 DIAGNOSIS — Z7902 Long term (current) use of antithrombotics/antiplatelets: Secondary | ICD-10-CM

## 2012-03-24 DIAGNOSIS — I5021 Acute systolic (congestive) heart failure: Secondary | ICD-10-CM | POA: Diagnosis present

## 2012-03-24 DIAGNOSIS — IMO0001 Reserved for inherently not codable concepts without codable children: Secondary | ICD-10-CM | POA: Diagnosis present

## 2012-03-24 DIAGNOSIS — I509 Heart failure, unspecified: Secondary | ICD-10-CM | POA: Diagnosis present

## 2012-03-24 DIAGNOSIS — IMO0002 Reserved for concepts with insufficient information to code with codable children: Secondary | ICD-10-CM

## 2012-03-24 DIAGNOSIS — I10 Essential (primary) hypertension: Secondary | ICD-10-CM

## 2012-03-24 DIAGNOSIS — Z794 Long term (current) use of insulin: Secondary | ICD-10-CM

## 2012-03-24 DIAGNOSIS — E669 Obesity, unspecified: Secondary | ICD-10-CM | POA: Diagnosis present

## 2012-03-24 DIAGNOSIS — I2510 Atherosclerotic heart disease of native coronary artery without angina pectoris: Secondary | ICD-10-CM

## 2012-03-24 DIAGNOSIS — I6529 Occlusion and stenosis of unspecified carotid artery: Secondary | ICD-10-CM | POA: Diagnosis present

## 2012-03-24 DIAGNOSIS — E119 Type 2 diabetes mellitus without complications: Secondary | ICD-10-CM

## 2012-03-24 DIAGNOSIS — F03918 Unspecified dementia, unspecified severity, with other behavioral disturbance: Secondary | ICD-10-CM

## 2012-03-24 DIAGNOSIS — K219 Gastro-esophageal reflux disease without esophagitis: Secondary | ICD-10-CM

## 2012-03-24 DIAGNOSIS — F411 Generalized anxiety disorder: Secondary | ICD-10-CM | POA: Diagnosis present

## 2012-03-24 DIAGNOSIS — I739 Peripheral vascular disease, unspecified: Secondary | ICD-10-CM

## 2012-03-24 DIAGNOSIS — G309 Alzheimer's disease, unspecified: Secondary | ICD-10-CM | POA: Diagnosis present

## 2012-03-24 DIAGNOSIS — F3289 Other specified depressive episodes: Secondary | ICD-10-CM | POA: Diagnosis present

## 2012-03-24 DIAGNOSIS — R9430 Abnormal result of cardiovascular function study, unspecified: Secondary | ICD-10-CM

## 2012-03-24 DIAGNOSIS — R05 Cough: Secondary | ICD-10-CM

## 2012-03-24 DIAGNOSIS — R51 Headache: Secondary | ICD-10-CM

## 2012-03-24 DIAGNOSIS — R112 Nausea with vomiting, unspecified: Secondary | ICD-10-CM

## 2012-03-24 DIAGNOSIS — E785 Hyperlipidemia, unspecified: Secondary | ICD-10-CM

## 2012-03-24 DIAGNOSIS — F0391 Unspecified dementia with behavioral disturbance: Secondary | ICD-10-CM

## 2012-03-24 DIAGNOSIS — R059 Cough, unspecified: Secondary | ICD-10-CM

## 2012-03-24 DIAGNOSIS — I779 Disorder of arteries and arterioles, unspecified: Secondary | ICD-10-CM

## 2012-03-24 DIAGNOSIS — Z7982 Long term (current) use of aspirin: Secondary | ICD-10-CM

## 2012-03-24 DIAGNOSIS — Z87891 Personal history of nicotine dependence: Secondary | ICD-10-CM

## 2012-03-24 HISTORY — DX: Atherosclerotic heart disease of native coronary artery without angina pectoris: I25.10

## 2012-03-24 HISTORY — PX: LEFT HEART CATHETERIZATION WITH CORONARY ANGIOGRAM: SHX5451

## 2012-03-24 LAB — BASIC METABOLIC PANEL
CO2: 19 mEq/L (ref 19–32)
Chloride: 104 mEq/L (ref 96–112)
Glucose, Bld: 87 mg/dL (ref 70–99)
Potassium: 4.1 mEq/L (ref 3.5–5.1)
Sodium: 136 mEq/L (ref 135–145)

## 2012-03-24 LAB — URINALYSIS, ROUTINE W REFLEX MICROSCOPIC
Glucose, UA: NEGATIVE mg/dL
Hgb urine dipstick: NEGATIVE
Specific Gravity, Urine: 1.014 (ref 1.005–1.030)

## 2012-03-24 LAB — CBC
Hemoglobin: 12.8 g/dL (ref 12.0–15.0)
RBC: 3.95 MIL/uL (ref 3.87–5.11)

## 2012-03-24 LAB — MRSA PCR SCREENING: MRSA by PCR: NEGATIVE

## 2012-03-24 LAB — TROPONIN I: Troponin I: 11.74 ng/mL (ref <0.30)

## 2012-03-24 LAB — D-DIMER, QUANTITATIVE: D-Dimer, Quant: 0.38 ug/mL-FEU (ref 0.00–0.48)

## 2012-03-24 LAB — POCT ACTIVATED CLOTTING TIME: Activated Clotting Time: 454 seconds

## 2012-03-24 SURGERY — LEFT HEART CATHETERIZATION WITH CORONARY ANGIOGRAM
Anesthesia: LOCAL

## 2012-03-24 MED ORDER — SODIUM CHLORIDE 0.9 % IV SOLN
INTRAVENOUS | Status: AC
  Administered 2012-03-24: 1000 mL via INTRAVENOUS

## 2012-03-24 MED ORDER — HEPARIN (PORCINE) IN NACL 100-0.45 UNIT/ML-% IJ SOLN
1000.0000 [IU]/h | INTRAMUSCULAR | Status: DC
  Administered 2012-03-24: 1000 [IU]/h via INTRAVENOUS
  Filled 2012-03-24: qty 250

## 2012-03-24 MED ORDER — ASPIRIN 81 MG PO CHEW
81.0000 mg | CHEWABLE_TABLET | Freq: Every day | ORAL | Status: DC
  Administered 2012-03-24 – 2012-03-29 (×6): 81 mg via ORAL
  Filled 2012-03-24 (×6): qty 1

## 2012-03-24 MED ORDER — ADENOSINE 6 MG/2ML IV SOLN
INTRAVENOUS | Status: AC
  Filled 2012-03-24: qty 2

## 2012-03-24 MED ORDER — ONDANSETRON HCL 4 MG/2ML IJ SOLN
4.0000 mg | Freq: Four times a day (QID) | INTRAMUSCULAR | Status: DC | PRN
  Administered 2012-03-24: 4 mg via INTRAVENOUS
  Filled 2012-03-24: qty 2

## 2012-03-24 MED ORDER — MIDAZOLAM HCL 2 MG/2ML IJ SOLN
INTRAMUSCULAR | Status: AC
  Filled 2012-03-24: qty 2

## 2012-03-24 MED ORDER — TICAGRELOR 90 MG PO TABS
ORAL_TABLET | ORAL | Status: AC
  Filled 2012-03-24: qty 2

## 2012-03-24 MED ORDER — MORPHINE SULFATE 10 MG/ML IJ SOLN
INTRAMUSCULAR | Status: AC
  Filled 2012-03-24: qty 1

## 2012-03-24 MED ORDER — GI COCKTAIL ~~LOC~~
30.0000 mL | Freq: Once | ORAL | Status: AC
  Administered 2012-03-24: 30 mL via ORAL
  Filled 2012-03-24: qty 30

## 2012-03-24 MED ORDER — ASPIRIN EC 325 MG PO TBEC
325.0000 mg | DELAYED_RELEASE_TABLET | Freq: Once | ORAL | Status: AC
  Administered 2012-03-24: 325 mg via ORAL
  Filled 2012-03-24: qty 1

## 2012-03-24 MED ORDER — INSULIN ASPART 100 UNIT/ML ~~LOC~~ SOLN
0.0000 [IU] | Freq: Three times a day (TID) | SUBCUTANEOUS | Status: DC
  Administered 2012-03-25: 2 [IU] via SUBCUTANEOUS
  Administered 2012-03-28: 3 [IU] via SUBCUTANEOUS

## 2012-03-24 MED ORDER — HEPARIN BOLUS VIA INFUSION
4000.0000 [IU] | Freq: Once | INTRAVENOUS | Status: AC
  Administered 2012-03-24: 4000 [IU] via INTRAVENOUS

## 2012-03-24 MED ORDER — INSULIN GLARGINE 100 UNIT/ML ~~LOC~~ SOLN
35.0000 [IU] | Freq: Every day | SUBCUTANEOUS | Status: DC
  Administered 2012-03-24 – 2012-03-25 (×2): 35 [IU] via SUBCUTANEOUS
  Administered 2012-03-26: 23:00:00 via SUBCUTANEOUS
  Administered 2012-03-28: 35 [IU] via SUBCUTANEOUS

## 2012-03-24 MED ORDER — METOPROLOL TARTRATE 12.5 MG HALF TABLET
12.5000 mg | ORAL_TABLET | Freq: Two times a day (BID) | ORAL | Status: DC
  Administered 2012-03-24 – 2012-03-25 (×2): 12.5 mg via ORAL
  Filled 2012-03-24 (×3): qty 1

## 2012-03-24 MED ORDER — ASPIRIN 81 MG PO CHEW
CHEWABLE_TABLET | ORAL | Status: AC
  Filled 2012-03-24: qty 4

## 2012-03-24 MED ORDER — ATROPINE SULFATE 1 MG/ML IJ SOLN
INTRAMUSCULAR | Status: AC
  Filled 2012-03-24: qty 1

## 2012-03-24 MED ORDER — ACETAMINOPHEN 325 MG PO TABS
650.0000 mg | ORAL_TABLET | ORAL | Status: DC | PRN
  Administered 2012-03-26: 650 mg via ORAL
  Filled 2012-03-24 (×2): qty 2

## 2012-03-24 MED ORDER — SERTRALINE HCL 100 MG PO TABS
100.0000 mg | ORAL_TABLET | Freq: Every day | ORAL | Status: DC
  Administered 2012-03-24 – 2012-03-29 (×6): 100 mg via ORAL
  Filled 2012-03-24 (×6): qty 1

## 2012-03-24 MED ORDER — BIVALIRUDIN 250 MG IV SOLR
INTRAVENOUS | Status: AC
  Filled 2012-03-24: qty 250

## 2012-03-24 MED ORDER — NITROGLYCERIN IN D5W 200-5 MCG/ML-% IV SOLN
2.0000 ug/min | INTRAVENOUS | Status: DC
  Administered 2012-03-24: 10 ug/min via INTRAVENOUS
  Filled 2012-03-24: qty 250

## 2012-03-24 MED ORDER — MEMANTINE HCL ER 28 MG PO CP24
28.0000 mg | ORAL_CAPSULE | Freq: Every day | ORAL | Status: DC
  Administered 2012-03-25 – 2012-03-29 (×5): 28 mg via ORAL
  Filled 2012-03-24 (×5): qty 28

## 2012-03-24 MED ORDER — MORPHINE SULFATE 2 MG/ML IJ SOLN
2.0000 mg | INTRAMUSCULAR | Status: DC | PRN
  Administered 2012-03-25: 2 mg via INTRAVENOUS
  Filled 2012-03-24: qty 1

## 2012-03-24 MED ORDER — MORPHINE SULFATE 2 MG/ML IJ SOLN
2.0000 mg | Freq: Once | INTRAMUSCULAR | Status: AC
  Administered 2012-03-24: 2 mg via INTRAVENOUS
  Filled 2012-03-24: qty 1

## 2012-03-24 MED ORDER — FENTANYL CITRATE 0.05 MG/ML IJ SOLN
INTRAMUSCULAR | Status: AC
  Filled 2012-03-24: qty 2

## 2012-03-24 MED ORDER — ATORVASTATIN CALCIUM 40 MG PO TABS
40.0000 mg | ORAL_TABLET | Freq: Every day | ORAL | Status: DC
  Administered 2012-03-25 – 2012-03-28 (×4): 40 mg via ORAL
  Filled 2012-03-24 (×6): qty 1

## 2012-03-24 MED ORDER — ESTRADIOL 1 MG PO TABS
1.0000 mg | ORAL_TABLET | Freq: Every day | ORAL | Status: DC
  Administered 2012-03-24 – 2012-03-25 (×2): 1 mg via ORAL
  Filled 2012-03-24 (×2): qty 1

## 2012-03-24 MED ORDER — ASPIRIN 81 MG PO CHEW
324.0000 mg | CHEWABLE_TABLET | Freq: Once | ORAL | Status: AC
  Administered 2012-03-24: 324 mg via ORAL

## 2012-03-24 MED ORDER — NITROGLYCERIN 1 MG/10 ML FOR IR/CATH LAB
INTRA_ARTERIAL | Status: AC
  Filled 2012-03-24: qty 10

## 2012-03-24 MED ORDER — TICAGRELOR 90 MG PO TABS
90.0000 mg | ORAL_TABLET | Freq: Two times a day (BID) | ORAL | Status: DC
  Administered 2012-03-25 – 2012-03-29 (×8): 90 mg via ORAL
  Filled 2012-03-24 (×10): qty 1

## 2012-03-24 NOTE — CV Procedure (Signed)
   Cardiac Catheterization Procedure Note  Name: Savannah [name] MRN: [id number] DOB: Aug 15, 1933  Procedure: Left Heart Cath, Selective Coronary Angiography, LV angiography  Indication: NSTEMI   Procedural details: The right groin was prepped, draped, and anesthetized with 1% lidocaine. Using modified Seldinger technique, a 5 French sheath was introduced into the right femoral artery. Standard Judkins catheters were used for coronary angiography and left ventriculography. Catheter exchanges were performed over a guidewire. There were no immediate procedural complications. The patient will proceed to PCI with Dr. Clifton James.  Procedural Findings: Hemodynamics:  AO 100/51 LV 101/28   Coronary angiography: Coronary dominance: right  Left mainstem: No significant disease.   Left anterior descending (LAD): 80% proximal LAD stenosis followed by a 95% thrombotic mid LAD stenosis with TIMI 2 flow beyond the stenosis.  Left circumflex (LCx): Small branching ramus with 50% ostial stenosis.  60% mLCx.    Right coronary artery (RCA): Total occlusion of the distal RCA with left to right collaterals.    Left ventriculography: LVEF 40% with akinesis of the mid to apical anterior wall, the apex, and the mid to apical inferior wall.  The basal segments are hyperkinetic.   Final Conclusions:  Suspect that the LAD thrombotic lesion is the culprit lesion for her current presentation.  The distal RCA is occluded with collaterals from the left.  We will treat the LAD via PCI and manage the RCA medically.   Marca Ancona 03/24/2012, 5:57 PM

## 2012-03-24 NOTE — ED Notes (Signed)
Pt c/o back pain since Monday. Went to doctor on Tuesday and then went back today and had a more tests done. EKG was performed at office and sent to her cardiologist. Once she was told about the ekg changes pt sts she started to get CP and the back pain worsened. Pt in nad, skin warm and dry, resp e/u. Pt was ambulatory to bed with no issues.

## 2012-03-24 NOTE — ED Notes (Signed)
Per EMS - pt went to PCP today for an appt, EKG done there had some changes from the last one. Once PCP told her there were some changes the pt started to have CP and anxiety attack at office. EMS was called. Pt has hx of CP/anxiety episodes. Pt has hx of fibromyalgia. Pt c/o central CP, worsens with pressure. No n/v/diaphrosis. BP 132/80 HR 80 irreg. PCP has called her cardiologist and he is supposed to be coming here to see her.

## 2012-03-24 NOTE — H&P (Signed)
History and Physical   Patient ID: Savannah [name] MRN: [id number], DOB/AGE: 1933-07-04   Admit date: 03/24/2012 Date of Consult: 03/24/2012   Primary Physician: Hoyle Sauer, MD Primary Cardiologist: Lovena Neighbours, MD   HPI: Savannah [name] is a 79 y.o. female with PMHx s/f atypical chest pain, carotid artery disease (60-79% L ICA), PVD, type 2 DM, HTN, HLD, dementia, anxiety/depression, panic attacks, fibromyalgia, GERD who was transferred from her PCP's office to [facility] ED for chest pain with EKG changes.   She has had a prior normal Lexiscan Myoview in 2010- low risk with small area of ischemia in the distal anterior wall/apex (breast attentuation could not be excluded). She has followed up with Dr. Myrtis Ser most recently in 05/2011. She was deemed stable from a cardiac perspective.   History was vague due to patient's mild dementia. She reports experiencing intermittent substernal chest pain x 10 days associated both at rest and exertion. Her husband has also noticed new dyspnea on exertion during this time. She denies orthopnea, PND, LE edema, palpitations or syncope. No fevers, chills or active bleeding.   EKG from her PCP's office reveals new anterior nonspecific ST changes, new anterolateral Q waves, old inferior Q waves. Trop-I returned elevated at 11.74. D-dimer WNL. U/a WNL. BMET- BUN 19, Cr 1.18. CBC unremarkable. CXR- no active disease, probable chronic mild interstitial prominence, degenerative thoracic spine changes.   Problem List: Past Medical History  Diagnosis Date  . Carotid artery disease     Doppler,  April, 2013, 0-39% R. ICA,  60-79% LICA.  Some progression on the left, plan followup 6 months  . Chest pain     Nuclear, November, 2010, no ischemia, normal ejection fraction  . Ejection fraction     EF normal, nuclear, 2010  . GERD (gastroesophageal reflux disease)   . Cough     May be related to GERD  . Alzheimer's dementia   . Diabetes mellitus     insulin  dependent  . Anxiety     Past Surgical History  Procedure Laterality Date  . Cholecystectomy       Allergies:  Allergies  Allergen Reactions  . Altace (Ramipril)     Home Medications: Prior to Admission medications   Medication Sig Start Date End Date Taking? Authorizing Provider  aspirin 325 MG tablet Take 325 mg by mouth daily.   Yes Historical Provider, MD  estradiol (ESTRACE) 1 MG tablet Take 1 mg by mouth daily.   Yes Historical Provider, MD  furosemide (LASIX) 20 MG tablet Take 20 mg by mouth daily.   Yes Historical Provider, MD  insulin glargine (LANTUS) 100 UNIT/ML injection Inject 35 Units into the skin daily.   Yes Historical Provider, MD  insulin lispro (HUMALOG) 100 UNIT/ML injection Inject 5 Units into the skin 2 (two) times daily.   Yes Historical Provider, MD  lisinopril (PRINIVIL,ZESTRIL) 10 MG tablet Take 10 mg by mouth daily.   Yes Historical Provider, MD  Memantine HCl ER (NAMENDA XR) 28 MG CP24 Take 28 mg by mouth daily.   Yes Historical Provider, MD  metFORMIN (GLUCOPHAGE) 1000 MG tablet Take 1,000 mg by mouth 2 (two) times daily with a meal.   Yes Historical Provider, MD  metoprolol succinate (TOPROL-XL) 50 MG 24 hr tablet Take 50 mg by mouth daily. Take with or immediately following a meal.   Yes Historical Provider, MD  pravastatin (PRAVACHOL) 40 MG tablet Take 40 mg by mouth daily.  05/13/11  Yes Historical Provider,  MD  Rivastigmine (EXELON) 13.3 MG/24HR PT24 Place 13.3 mg onto the skin daily.   Yes Historical Provider, MD  sertraline (ZOLOFT) 100 MG tablet Take 100 mg by mouth daily.   Yes Historical Provider, MD    Inpatient Medications:    (Not in a hospital admission)  No family history on file.   History   Social History  . Marital Status: Married    Spouse Name: N/A    Number of Children: N/A  . Years of Education: N/A   Occupational History  . Not on file.   Social History Main Topics  . Smoking status: Former Smoker    Quit date:  12/09/1971  . Smokeless tobacco: Never Used  . Alcohol Use: No  . Drug Use: No  . Sexually Active: No   Other Topics Concern  . Not on file   Social History Narrative  . No narrative on file     Review of Systems: General: negative for chills, fever, night sweats or weight changes.  Cardiovascular: positive for chest pain and dyspnea on exertion, negative for edema, orthopnea, palpitations, paroxysmal nocturnal dyspnea or shortness of breath Dermatological: negative for rash Respiratory: negative for cough or wheezing Urologic: negative for hematuria Abdominal: negative for nausea, vomiting, diarrhea, bright red blood per rectum, melena, or hematemesis Neurologic:  negative for visual changes, syncope, or dizziness All other systems reviewed and are otherwise negative except as noted above.  Physical Exam: Blood pressure 111/50, pulse 69, temperature 98.3 F (36.8 C), temperature source Oral, resp. rate 14, SpO2 99.00%.   General: Well developed, well nourished, in no acute distress. Head: Normocephalic, atraumatic, sclera non-icteric, no xanthomas, nares are without discharge.  Neck: Negative for carotid bruits. JVD not elevated. Lungs: Clear bilaterally to auscultation without wheezes, rales, or rhonchi. Breathing is unlabored. Heart: RRR with S1 S2. No murmurs, rubs, or gallops appreciated. Abdomen: Soft, non-tender, non-distended with normoactive bowel sounds. No hepatomegaly. No rebound/guarding. No obvious abdominal masses. Msk:  Strength and tone appears normal for age. Extremities: No clubbing, cyanosis or edema.  Distal pedal pulses are 2+ and equal bilaterally. Neuro: Alert and oriented X 3. Moves all extremities spontaneously. Psych:  Responds to questions appropriately with a normal affect.  Labs: Recent Labs     03/24/12  1544  WBC  9.8  HGB  12.8  HCT  37.0  MCV  93.7  PLT  190   Recent Labs     03/24/12  1544  DDIMER  0.38    Recent Labs Lab  03/24/12 1544  NA 136  K 4.1  CL 104  CO2 19  BUN 29*  CREATININE 1.18*  CALCIUM 9.5  GLUCOSE 87   Recent Labs     03/24/12  1544  TROPONINI  11.74*   Radiology/Studies: Dg Chest 2 View  03/24/2012  *RADIOLOGY REPORT*  Clinical Data: Chest pain, weakness  CHEST - 2 VIEW  Comparison: 12/09/2011  Findings: Cardiomediastinal silhouette is stable.  No acute infiltrate or pulmonary edema.  Probable chronic mild interstitial prominence.  Stable degenerative changes thoracic spine.  IMPRESSION: No active disease.  Probable chronic mild interstitial prominence. Degenerative changes thoracic spine.   Original Report Authenticated By: Natasha Mead, M.D.     EKG: NSR, 77 bpm, new nonspecific anterior ST elevation, IVCD/Q waves II, III, aVF (unchanged)  ASSESSMENT:   Savannah [name] is a 79 y.o. female with PMHx s/f atypical chest pain, carotid artery disease (60-79% L ICA), PVD, type 2 DM, HTN, HLD,  dementia, anxiety/depression, panic attacks, fibromyalgia, GERD who was transferred from her PCP's office to [facility] ED for chest pain with EKG changes.   1. NSTEMI 2. Type 2 DM 3. HTN 4. HLD 5. Carotid artery disease  6. PVD 7. Dementia 8. Anxiety/depression 9. GERD  DISCUSSION/PLAN:  Patient's initial trop-I returned elevated. Nonspecific anterior ST elevations on EKG likely reflect evolving STEMI incurred several days ago. Patient still experiencing 6-7/10 chest pain. Has received full-dose ASA. Start heparin, NTG gtt. Currently undergoing urgent cardiac catheterization. Further plans per interventionalist's findings. Plan SSI while inpatient. Replace pravastatin with high-dose atorvastatin. Reduce ASA to 81 mg. Continue ACEi, BB. Continue home meds otherwise.    Signed, R. Hurman Horn, PA-C 03/24/2012, 5:29 PM

## 2012-03-24 NOTE — CV Procedure (Signed)
   Cardiac Catheterization Operative Report  Savannah [name] [id number] 2/21/20146:50 PM Hoyle Sauer, MD  Procedure Performed:  1. PTCA/DES x 3 mid LAD   Operator: Verne Carrow, MD  Indication:  79 yo female with history of DM, HTN and dementia admitted with chest pain, found to have elevated troponin. Diagnostic cath per Dr. Shirlee Latch. Pt found to have severe disease in the mid LAD, chronic occlusion of the distal RCA with collaterals and moderate disease in the Circumflex. I was asked to perform PCI of the mid LAD                            Procedure Details: The risks, benefits, complications, treatment options, and expected outcomes were discussed with the patient. The patient and/or family concurred with the proposed plan, giving informed consent. When I entered the case, there was a 5 [place] sheath present in the right femoral artery. The sheath was changed to a 6 [place] system. She was given a bolus of Angiomax and a drip was started. She was given 180 mg po Brilinta x 1. I then engaged the left main artery with a XB LAD 3.5 guiding catheter. A BMW wire was advanced down the LAD into the distal vessel. I then pre-dilated both lesions in the mid vessel with a 2.0 x 20 mm balloon. I then deployed a 2.5 x 24 mm Promus Premier DES in the mid LAD. A 3.0 x 28 mm Promus Premier DES was placed in an overlapping fashion more proximal to the first stent. After stent deployment, there was no reflow into the distal vessel. I could not be certain that this was not a distal edge tear just beyond the most distal stent. I elected to place a 2.5 x 8 mm Promus Premier DES in an overlapping fashion with most distal stent. This did not improve flow significantly. The flow was likely reduced secondary to small vessel distal embolization. I then began to give IC NTG and adenosine and flow normalized. She did have significant pain during the no reflow period. I then post-dilated the most distal stented  segment with a 2.5 x 20 mm [place] balloon x 2. I then post-dilated the more proximal stented segment with a 3.25 x 15 mm Philo balloon x 2. Additional IC NTG and IC adenosine was given. Flow into the distal vessel at the conclusion of the case was normal. The arterial stick was at the bifurcation in the right femoral artery and not favorable for a closure device. The patient was taken to the recovery area in stable condition.   Hemodynamic Findings: Central aortic pressure: 96/46  Impression: 1. Successful PTCA/DES x 3 mid LAD   Recommendations: She will need ASA and Brilinta for one year. Will continue beta blocker and statin. Will monitor in CCU.        Complications:  None; patient tolerated the procedure well.

## 2012-03-24 NOTE — ED Notes (Signed)
Pt returned from radiology. Placed back on monitor.  

## 2012-03-24 NOTE — ED Provider Notes (Signed)
I saw and evaluated the patient, reviewed the resident's note and I agree with the findings and plan.  I personally evaluated the ECG and agree with the interpretation of the resident   Lyanne Co, MD 03/24/12 [phone]

## 2012-03-24 NOTE — Progress Notes (Signed)
Orthopedic Tech Progress Note Patient Details:  Savannah [name] Dec 18, 1933 [id number]  Ortho Devices Type of Ortho Device: Knee Immobilizer Ortho Device/Splint Location: right knee Ortho Device/Splint Interventions: Application As ordered by Doctor Andrew Au, Janisha Bueso 03/24/2012, 7:24 PM

## 2012-03-24 NOTE — H&P (Signed)
Patient examined chart reviewed.  Stuttering anterior MI.  Still with pain in ER and troponin 11.  Discussed acute cath with patient and husband.  Echo shows EF 45% with septal and apical akinesis no thrombus.  Heparin and aspirin given in ER.  Dr Shirlee Latch to start case.  Labs otherwise ok  Exam remarkable for left carotid bruit with known 60-79% LICA followed by Dr Salley Hews

## 2012-03-24 NOTE — ED Notes (Signed)
Went into Patient room to try and get urine.  Patient is on her way to Xray.

## 2012-03-24 NOTE — ED Provider Notes (Addendum)
Date: 03/24/2012  Rate: 77  Rhythm: normal sinus rhythm  QRS Axis: normal  Intervals: normal  ST/T Wave abnormalities: normal  Conduction Disutrbances: None serious T. Changes  Narrative Interpretation:   Old EKG Reviewed: No significant changes noted  The patient's symptoms are very atypical.  Sound like there is a constant nature of her chest pain.  We will continue to treat her pain at this time.  It sounds noncardiac.  However her primary care physician was concerned about her chest pain and discussed her care with her cardiologist who recommended evaluation emergency department.  We have spoken with the cardiology team and they will assist and evaluated the bedside.  We appreciate their input as she is a difficult historian secondary to her dementia.  Dg Chest 2 View  03/24/2012  *RADIOLOGY REPORT*  Clinical Data: Chest pain, weakness  CHEST - 2 VIEW  Comparison: 12/09/2011  Findings: Cardiomediastinal silhouette is stable.  No acute infiltrate or pulmonary edema.  Probable chronic mild interstitial prominence.  Stable degenerative changes thoracic spine.  IMPRESSION: No active disease.  Probable chronic mild interstitial prominence. Degenerative changes thoracic spine.   Original Report Authenticated By: Natasha Mead, M.D.    Results for orders placed during the hospital encounter of 03/24/12  CBC      Result Value Range   WBC 9.8  4.0 - 10.5 K/uL   RBC 3.95  3.87 - 5.11 MIL/uL   Hemoglobin 12.8  12.0 - 15.0 g/dL   HCT 40.9  81.1 - 91.4 %   MCV 93.7  78.0 - 100.0 fL   MCH 32.4  26.0 - 34.0 pg   MCHC 34.6  30.0 - 36.0 g/dL   RDW 78.2  95.6 - 21.3 %   Platelets 190  150 - 400 K/uL  BASIC METABOLIC PANEL      Result Value Range   Sodium 136  135 - 145 mEq/L   Potassium 4.1  3.5 - 5.1 mEq/L   Chloride 104  96 - 112 mEq/L   CO2 19  19 - 32 mEq/L   Glucose, Bld 87  70 - 99 mg/dL   BUN 29 (*) 6 - 23 mg/dL   Creatinine, Ser 0.86 (*) 0.50 - 1.10 mg/dL   Calcium 9.5  8.4 - 57.8 mg/dL   GFR calc non Af Amer 44 (*) >90 mL/min   GFR calc Af Amer 51 (*) >90 mL/min  URINALYSIS, ROUTINE W REFLEX MICROSCOPIC      Result Value Range   Color, Urine YELLOW  YELLOW   APPearance CLEAR  CLEAR   Specific Gravity, Urine 1.014  1.005 - 1.030   pH 7.5  5.0 - 8.0   Glucose, UA NEGATIVE  NEGATIVE mg/dL   Hgb urine dipstick NEGATIVE  NEGATIVE   Bilirubin Urine NEGATIVE  NEGATIVE   Ketones, ur 15 (*) NEGATIVE mg/dL   Protein, ur NEGATIVE  NEGATIVE mg/dL   Urobilinogen, UA 1.0  0.0 - 1.0 mg/dL   Nitrite NEGATIVE  NEGATIVE   Leukocytes, UA NEGATIVE  NEGATIVE  TROPONIN I      Result Value Range   Troponin I 11.74 (*) <0.30 ng/mL  D-DIMER, QUANTITATIVE      Result Value Range   D-Dimer, Quant 0.38  0.00 - 0.48 ug/mL-FEU        Lyanne Co, MD 03/24/12 1630  5:01 PM Troponin is 11.  Aspirin.  Heparin drip per pharmacy.  Cardiology to admit. CRITICAL CARE Performed by: Lyanne Co Total critical care  time: 35 Critical care time was exclusive of separately billable procedures and treating other patients. Critical care was necessary to treat or prevent imminent or life-threatening deterioration. Critical care was time spent personally by me on the following activities: development of treatment plan with patient and/or surrogate as well as nursing, discussions with consultants, evaluation of patient's response to treatment, examination of patient, obtaining history from patient or surrogate, ordering and performing treatments and interventions, ordering and review of laboratory studies, ordering and review of radiographic studies, pulse oximetry and re-evaluation of patient's condition.  Lyanne Co, MD 03/24/12 1701  5:08 PM I spoke with cardiology just evaluated the patient the bedside.  She will be going to the catheter lab at this time given her active chest pain, and troponin of 11.  EKG is not consistent with ST elevation MI  Lyanne Co, MD 03/24/12 [phone]

## 2012-03-24 NOTE — ED Provider Notes (Signed)
History     CSN: [id number]  Arrival date & time 03/24/12  1408   None     Chief Complaint  Patient presents with  . Chest Pain   HPI Comments: 79 y.o PMH right carotid stenosis 60-79%, DM 2, chronic pain, GERD, h/a, dementia, fibromyalgia, obesity, anxiety/depression, h/o atypical chest pain, dyslipidemia, PVD, ?EF (<50% per PCP notes), ?CAD  She went to her PCP today Dr. Felipa Eth.  She has had 1 week of chest pain radiating to her back (between her shoulder blades) which is new.  Pain was intermittent but for the last 2 days worsening, constant.  She went to her PCP today and her PCP noted EKG changes, CP worse with pressure, anxiety and sent her to the ED.  Chest pain is worse with deep inspiration.  She feels like pain is at the "bottom of her lung" and feels like "something is mashing on a nerve".  Denies n/v per husband patient was nauseated 10 days ago.    Collateral: Spoke with physician at Dr. Vicente Males office.  Per conversation patient has 2-3 week history of chronic pain in thoracic area. Significant psychiatric overlay so sent to ED for further w/u.  EKG with significant changes in anterior leads.    SH: Married x >50 years. Last hospitalization 12/2011. Denies sick contacts. Former smoker. Retired Systems developer.  PCP: Dr. Felipa Eth Cardiologist Dr. Myrtis Ser  Patient is a 79 y.o. female presenting with chest pain. The history is provided by the patient and the spouse. No language interpreter was used.  Chest Pain Chest pain location: central chest. Pain radiates to the back: yes   Timing:  Intermittent (intermittent now constant) Progression:  Worsening Chronicity:  New Context comment:  Worse with breathing Relieved by:  None tried Worsened by:  Deep breathing Associated symptoms: back pain and shortness of breath   Associated symptoms: no cough, no lower extremity edema, no nausea, no palpitations and not vomiting   Associated symptoms comment:  Denies lower extremity pain Sob  with exertion Worsening cp with deep breathing CP radiates to back below shoulder blades  Risk factors: hypertension and obesity   Risk factors: no prior DVT/PE     Past Medical History  Diagnosis Date  . Carotid artery disease     Doppler,  April, 2013, 0-39% R. ICA,  60-79% LICA.  Some progression on the left, plan followup 6 months  . Chest pain     Nuclear, November, 2010, no ischemia, normal ejection fraction  . Ejection fraction     EF normal, nuclear, 2010  . GERD (gastroesophageal reflux disease)   . Cough     May be related to GERD  . Alzheimer's dementia   . Diabetes mellitus     insulin dependent  . Anxiety     Past Surgical History  Procedure Laterality Date  . Cholecystectomy      No family history on file.  History  Substance Use Topics  . Smoking status: Former Smoker    Quit date: 12/09/1971  . Smokeless tobacco: Never Used  . Alcohol Use: No    OB History   Grav Para Term Preterm Abortions TAB SAB Ect Mult Living                  Review of Systems  Respiratory: Positive for shortness of breath. Negative for cough.   Cardiovascular: Positive for chest pain. Negative for palpitations and leg swelling.  Gastrointestinal: Negative for nausea and vomiting.  Musculoskeletal:  Positive for back pain.  All other systems reviewed and are negative.    Allergies  Altace  Home Medications   Current Outpatient Rx  Name  Route  Sig  Dispense  Refill  . aspirin 325 MG tablet   Oral   Take 325 mg by mouth daily.         Marland Kitchen estradiol (ESTRACE) 1 MG tablet   Oral   Take 1 mg by mouth daily.         . furosemide (LASIX) 20 MG tablet   Oral   Take 20 mg by mouth daily.         . insulin glargine (LANTUS) 100 UNIT/ML injection   Subcutaneous   Inject 35 Units into the skin daily.         . insulin lispro (HUMALOG) 100 UNIT/ML injection   Subcutaneous   Inject 5 Units into the skin 2 (two) times daily.         Marland Kitchen lisinopril  (PRINIVIL,ZESTRIL) 10 MG tablet   Oral   Take 10 mg by mouth daily.         . Memantine HCl ER (NAMENDA XR) 28 MG CP24   Oral   Take 28 mg by mouth daily.         . metFORMIN (GLUCOPHAGE) 1000 MG tablet   Oral   Take 1,000 mg by mouth 2 (two) times daily with a meal.         . metoprolol succinate (TOPROL-XL) 50 MG 24 hr tablet   Oral   Take 50 mg by mouth daily. Take with or immediately following a meal.         . pravastatin (PRAVACHOL) 40 MG tablet   Oral   Take 40 mg by mouth daily.          . Rivastigmine (EXELON) 13.3 MG/24HR PT24   Transdermal   Place 13.3 mg onto the skin daily.         . sertraline (ZOLOFT) 100 MG tablet   Oral   Take 100 mg by mouth daily.           BP 119/54  Pulse 73  Temp(Src) 98.3 F (36.8 C) (Oral)  Resp 20  SpO2 100%  Physical Exam  Nursing note and vitals reviewed. Constitutional: She is oriented to person, place, and time. Vital signs are normal. She appears well-developed and well-nourished. She is cooperative. No distress.  HENT:  Head: Normocephalic and atraumatic.  Mouth/Throat: Oropharynx is clear and moist and mucous membranes are normal. No oropharyngeal exudate.  Eyes: Conjunctivae are normal. Pupils are equal, round, and reactive to light. Right eye exhibits no discharge. Left eye exhibits no discharge. No scleral icterus.  Cardiovascular: Normal rate, regular rhythm, S1 normal, S2 normal and normal heart sounds.   No murmur heard. Pulmonary/Chest: Effort normal and breath sounds normal. No respiratory distress. She has no wheezes.    Reproducible chest wall ttp  Abdominal: Soft. Bowel sounds are normal. There is no tenderness.  Obese ab  Neurological: She is alert and oriented to person, place, and time.  Skin: Skin is warm, dry and intact. No rash noted. She is not diaphoretic.  Psychiatric: She has a normal mood and affect. Her speech is normal and behavior is normal. Judgment and thought content  normal. Cognition and memory are impaired.  H/o dementia    ED Course  Procedures   Labs Reviewed  BASIC METABOLIC PANEL - Abnormal; Notable for the following:  BUN 29 (*)    Creatinine, Ser 1.18 (*)    GFR calc non Af Amer 44 (*)    GFR calc Af Amer 51 (*)    All other components within normal limits  CBC  URINALYSIS, ROUTINE W REFLEX MICROSCOPIC  TROPONIN I  D-DIMER, QUANTITATIVE   Dg Chest 2 View  03/24/2012  *RADIOLOGY REPORT*  Clinical Data: Chest pain, weakness  CHEST - 2 VIEW  Comparison: 12/09/2011  Findings: Cardiomediastinal silhouette is stable.  No acute infiltrate or pulmonary edema.  Probable chronic mild interstitial prominence.  Stable degenerative changes thoracic spine.  IMPRESSION: No active disease.  Probable chronic mild interstitial prominence. Degenerative changes thoracic spine.   Original Report Authenticated By: Natasha Mead, M.D.      1. Chest pain   2. Back pain     Date: 03/24/2012  Rate: 77  Rhythm: normal sinus rhythm  QRS Axis: left  Intervals: normal  ST/T Wave abnormalities: EKG from PCP's office 1334 with J point elevation and slight ST elevation anterior/lat (V1-V6) leads; EKG in ED does not show as significant changes in ant. leads  Conduction Disutrbances:possible new incomplete LBBB  Narrative Interpretation:   Old EKG Reviewed: changes noted     MDM  Vitals on exam BP 117/62, 73/17/100% room air Ddx: PE, ACS, atypical chest pain,other noncardiac (anxiety, GI related), aortic dissection  BMET, cbc, d dimer, EKG, CXR, UA, trop ASA, GI cocktail, morphine  TIMI risk score at least 4  PCP notified Dr. Myrtis Ser earlier today Called Trish with [facility] to see the patient   [facility] MD [phone]         Annett Gula, MD 03/24/12 1631  Annett Gula, MD 03/24/12 [phone]

## 2012-03-24 NOTE — Progress Notes (Signed)
ANTICOAGULATION CONSULT NOTE - Initial Consult  Pharmacy Consult for heparin Indication: chest pain/ACS  Allergies  Allergen Reactions  . Altace (Ramipril)     Patient Measurements:   Heparin Dosing Weight: 80.6kg  Vital Signs: Temp: 98.3 F (36.8 C) (02/21 1423) Temp src: Oral (02/21 1423) BP: 111/50 mmHg (02/21 1630) Pulse Rate: 69 (02/21 1630)  Labs:  Recent Labs  03/24/12 1544  HGB 12.8  HCT 37.0  PLT 190  CREATININE 1.18*  TROPONINI 11.74*    The CrCl is unknown because both a height and weight (above a minimum accepted value) are required for this calculation.   Medical History: Past Medical History  Diagnosis Date  . Carotid artery disease     Doppler,  April, 2013, 0-39% R. ICA,  60-79% LICA.  Some progression on the left, plan followup 6 months  . Chest pain     Nuclear, November, 2010, no ischemia, normal ejection fraction  . Ejection fraction     EF normal, nuclear, 2010  . GERD (gastroesophageal reflux disease)   . Cough     May be related to GERD  . Alzheimer's dementia   . Diabetes mellitus     insulin dependent  . Anxiety     Medications:   (Not in a hospital admission)  Assessment: 86 yof presented to the ED with chest pain to start IV heparin and proceed to the cath lab. Troponins significantly elevated >11. CBC is WNL. She is not on any anticoagulants PTA.  Goal of Therapy:  Heparin level 0.3-0.7 units/ml Monitor platelets by anticoagulation protocol: Yes   Plan:  1. Heparin bolus 4000 units IV x 1 2. Heparin gtt 1000 units/hr 3. Pt going to cath now - will f/u post-cath  Nephtali Docken, Drake Leach 03/24/2012,5:05 PM

## 2012-03-25 DIAGNOSIS — E785 Hyperlipidemia, unspecified: Secondary | ICD-10-CM | POA: Diagnosis present

## 2012-03-25 DIAGNOSIS — I214 Non-ST elevation (NSTEMI) myocardial infarction: Secondary | ICD-10-CM

## 2012-03-25 DIAGNOSIS — I10 Essential (primary) hypertension: Secondary | ICD-10-CM | POA: Diagnosis present

## 2012-03-25 DIAGNOSIS — I059 Rheumatic mitral valve disease, unspecified: Secondary | ICD-10-CM

## 2012-03-25 DIAGNOSIS — F0391 Unspecified dementia with behavioral disturbance: Secondary | ICD-10-CM | POA: Diagnosis present

## 2012-03-25 LAB — GLUCOSE, CAPILLARY
Glucose-Capillary: 89 mg/dL (ref 70–99)
Glucose-Capillary: 118 mg/dL — ABNORMAL HIGH (ref 70–99)

## 2012-03-25 LAB — BASIC METABOLIC PANEL
CO2: 21 mEq/L (ref 19–32)
Chloride: 106 mEq/L (ref 96–112)
GFR calc non Af Amer: 44 mL/min — ABNORMAL LOW (ref >90)
Glucose, Bld: 78 mg/dL (ref 70–99)
Potassium: 4.4 mEq/L (ref 3.5–5.1)
Sodium: 138 mEq/L (ref 135–145)

## 2012-03-25 LAB — CBC
Hemoglobin: 11.3 g/dL — ABNORMAL LOW (ref 12.0–15.0)
MCHC: 34 g/dL (ref 30.0–36.0)
RBC: 3.53 MIL/uL — ABNORMAL LOW (ref 3.87–5.11)
WBC: 8.6 10*3/uL (ref 4.0–10.5)

## 2012-03-25 MED ORDER — LISINOPRIL 10 MG PO TABS
10.0000 mg | ORAL_TABLET | Freq: Every day | ORAL | Status: DC
  Administered 2012-03-26 – 2012-03-29 (×4): 10 mg via ORAL
  Filled 2012-03-25 (×4): qty 1

## 2012-03-25 MED ORDER — ZOLPIDEM TARTRATE 5 MG PO TABS
5.0000 mg | ORAL_TABLET | Freq: Every evening | ORAL | Status: DC | PRN
  Administered 2012-03-28: 5 mg via ORAL
  Filled 2012-03-25: qty 1

## 2012-03-25 MED ORDER — SODIUM CHLORIDE 0.9 % IV SOLN: INTRAVENOUS | Status: DC

## 2012-03-25 MED ORDER — METOPROLOL SUCCINATE ER 50 MG PO TB24
50.0000 mg | ORAL_TABLET | Freq: Every day | ORAL | Status: DC
  Administered 2012-03-25 – 2012-03-29 (×5): 50 mg via ORAL
  Filled 2012-03-25 (×6): qty 1

## 2012-03-25 NOTE — Progress Notes (Signed)
CARDIAC REHAB PHASE I   PRE:  Rate/Rhythm: 66 SR    BP: sitting 107/50    SaO2: 98 RA  MODE:  Ambulation: 170 ft   POST:  Rate/Rhythm: 80 SR    BP: sitting 107/43     SaO2: 94 RA  Ambulated assist x2 with RW. Pt weak and anxious. Needed constant reminders about her heart disease. To BSC then recliner. Pt unpredictable and will continue to need assistance. Discussed MI/stent with pt and family. Family supportive however their living situation is not working. Would like to talk with CSW about options. They have pursued [facility] and were declined due to her advanced disease. Husband has many mobility limitations. Will f/u Monday. [phone]  Elissa Lovett [place] CES, ACSM

## 2012-03-25 NOTE — Progress Notes (Signed)
  Echocardiogram 2D Echocardiogram has been performed.  Savannah [name] 03/25/2012, 10:42 AM

## 2012-03-25 NOTE — Progress Notes (Signed)
Sheath pulled at 2220 on 03/24/2012. Right groin is a level 0. Pressure held for 25 minutes. No signs of bleeding noted. Patient tolerated procedure well. Pressure dressing applied. Taught patient post sheath pull instructions and bedrest. Patient able to verbalize understanding of instructions. Will reinforce instructions frequently as patient has severe memory problems.

## 2012-03-25 NOTE — Progress Notes (Signed)
Pt transferred to room 3022 via wheelchair; accompanied by RN, NT, and family. Report given to receiving nurse, Bosie Clos. Belongings taken home by family. Patient has no complaints at this time.  Dawson Bills., RN

## 2012-03-25 NOTE — Progress Notes (Signed)
Patient Name: Savannah [name] Date of Encounter: 03/25/2012  Principal Problem:   NSTEMI (non-ST elevated myocardial infarction) Active Problems:   Carotid artery disease   GERD (gastroesophageal reflux disease)   Type II or unspecified type diabetes mellitus without mention of complication, not stated as uncontrolled   Essential hypertension, benign   PVD (peripheral vascular disease)   Coronary artery disease   Dementia with behavioral disturbance   Other and unspecified hyperlipidemia    SUBJECTIVE S/p DES x 3 to LAD on 2/21  Confused this morning - thinks she is at home. Does not remember procedure yesterday. Sitter at bedside. Reports generalized aches, but no chest pain, SOB, or dizziness.  CURRENT MEDS . aspirin  81 mg Oral Daily  . atorvastatin  40 mg Oral q1800  . estradiol  1 mg Oral Daily  . insulin aspart  0-15 Units Subcutaneous TID WC  . insulin glargine  35 Units Subcutaneous QHS  . Memantine HCl ER  28 mg Oral Daily  . metoprolol tartrate  12.5 mg Oral BID  . sertraline  100 mg Oral Daily  . Ticagrelor  90 mg Oral BID    OBJECTIVE  Filed Vitals:   03/25/12 0600 03/25/12 0700 03/25/12 0737 03/25/12 0800  BP: 91/39 101/46  102/50  Pulse: 71 72  74  Temp:   98.3 F (36.8 C)   TempSrc:   Oral   Resp: 16 15  16   Height:      Weight:      SpO2: 100% 100%  97%    Intake/Output Summary (Last 24 hours) at 03/25/12 0817 Last data filed at 03/25/12 0800  Gross per 24 hour  Intake    799 ml  Output    525 ml  Net    274 ml   Filed Weights   03/24/12 2015  Weight: 229 lb 11.5 oz (104.2 kg)    PHYSICAL EXAM  General: Pleasant. NAD Neuro: Alert and oriented X person, birthdate; not oriented to place or year. Moves all extremities spontaneously. HEENT:  Normal  Neck: Supple without bruits or JVD. Lungs:  Resp regular and unlabored, CTA. Heart: RRR no s3, s4, or murmurs. Abdomen: Soft, non-tender, non-distended, BS + x 4.  Extremities: No  clubbing, cyanosis or edema. DP/PT/Radials 2+ and equal bilaterally.  Accessory Clinical Findings  CBC  Recent Labs  03/24/12 1544 03/25/12 0700  WBC 9.8 8.6  HGB 12.8 11.3*  HCT 37.0 33.2*  MCV 93.7 94.1  PLT 190 197   Basic Metabolic Panel  Recent Labs  03/24/12 1544 03/25/12 0700  NA 136 138  K 4.1 4.4  CL 104 106  CO2 19 21  GLUCOSE 87 78  BUN 29* 27*  CREATININE 1.18* 1.18*  CALCIUM 9.5 8.8   Cardiac Enzymes  Recent Labs  03/24/12 1544  TROPONINI 11.74*   D-Dimer  Recent Labs  03/24/12 1544  DDIMER 0.38    TELE - sinus  ECG - am ECG pending  Radiology/Studies  Dg Chest 2 View  03/24/2012   IMPRESSION: No active disease.  Probable chronic mild interstitial prominence. Degenerative changes thoracic spine.     ASSESSMENT AND PLAN ZYLAH ELSBERND is a 79 y.o. female with PMHx s/f atypical chest pain, carotid artery disease (60-79% L ICA), PVD, type 2 DM, HTN, HLD, dementia, anxiety/depression, panic attacks, fibromyalgia, GERD who was transferred from her PCP's office to [facility] ED for chest pain with EKG changes on 03/24/12.  # NSTEMI : day 1  s/p PCI with DES x3 to mid LAD; On cath ,RCA was totally occluded but with collaterals, and thus no interventions. -ASA, Brilinta, atorvastatin, restart home dose metoprolol -Discontinue estradiol  #Acute  Systolic CHF: EF 16% on cath. Echo being done now. -Follow echo -Continue BB and ACEI -No s/s of fluid overload, hold home lasix for now given mildly elevated Cr  # Type 2 DM A1c = 5.9 on 12/09/11; home regimen includes metformin & lantus 35u qHS and lispro 5u BIDWC -Home lantus continued with SSI -metformin held  # HTN : Home regimen includes lasix 20mg  daily, lisinopril 10mg  daily, metoprolol XL 50mg  daily -restart lisinopril  # HLD - statin started, check lipid panel  # Carotid artery disease : left carotid bruit with known 60-79% LICA followed by Dr Myrtis Ser # PVD  # Dementia  # Anxiety/depression -  continue home sertraline # GERD # Dispo: pending clinical improvement, consider transfer to tele   Signed, Stacy Gardner MD, PGY2  Patient seen and examined with Ulyess Blossom, PA-C. We discussed all aspects of the encounter. I agree with the assessment and plan as stated above.   No further angina. Can stop NTG. Continue b-blocker, ACE-I and Brillinta. Will need cardiac rehab. Can go to tele.   Daniel Bensimhon,MD 10:30 AM

## 2012-03-26 LAB — GLUCOSE, CAPILLARY
Glucose-Capillary: 102 mg/dL — ABNORMAL HIGH (ref 70–99)
Glucose-Capillary: 115 mg/dL — ABNORMAL HIGH (ref 70–99)

## 2012-03-26 NOTE — Progress Notes (Signed)
Patient ID: [name], female   DOB: 1933/12/13, 79 y.o.   MRN: [id number]   Patient Name: Savannah [name] Date of Encounter: 03/26/2012  Principal Problem:   NSTEMI (non-ST elevated myocardial infarction) Active Problems:   Carotid artery disease   GERD (gastroesophageal reflux disease)   Type II or unspecified type diabetes mellitus without mention of complication, not stated as uncontrolled   Essential hypertension, benign   PVD (peripheral vascular disease)   Coronary artery disease   Dementia with behavioral disturbance   Other and unspecified hyperlipidemia    SUBJECTIVE S/p DES x 3 to LAD on 2/21  No chest pain More coherent interacting with husband and daughter  CURRENT MEDS . aspirin  81 mg Oral Daily  . atorvastatin  40 mg Oral q1800  . insulin aspart  0-15 Units Subcutaneous TID WC  . insulin glargine  35 Units Subcutaneous QHS  . lisinopril  10 mg Oral Daily  . Memantine HCl ER  28 mg Oral Daily  . metoprolol succinate  50 mg Oral Daily  . sertraline  100 mg Oral Daily  . Ticagrelor  90 mg Oral BID    OBJECTIVE  Filed Vitals:   03/25/12 2056 03/26/12 0516 03/26/12 0618 03/26/12 1044  BP: 119/50 115/54  123/53  Pulse: 82 80  76  Temp: 99.1 F (37.3 C) 99.4 F (37.4 C)    TempSrc: Oral Oral    Resp: 18 18    Height:      Weight:   209 lb 7 oz (95 kg)   SpO2: 97% 99%      Intake/Output Summary (Last 24 hours) at 03/26/12 1105 Last data filed at 03/26/12 0630  Gross per 24 hour  Intake     60 ml  Output      0 ml  Net     60 ml   Filed Weights   03/24/12 2015 03/26/12 0618  Weight: 229 lb 11.5 oz (104.2 kg) 209 lb 7 oz (95 kg)    PHYSICAL EXAM  General: Pleasant. NAD Neuro: Alert and oriented X person, birthdate; not oriented to place or year. Moves all extremities spontaneously. HEENT:  Normal  Neck: Supple without bruits or JVD. Lungs:  Resp regular and unlabored, CTA. Heart: RRR no s3, s4, or murmurs. Abdomen: Soft, non-tender,  non-distended, BS + x 4.  Extremities: No clubbing, cyanosis or edema. DP/PT/Radials 2+ and equal bilaterally.  Accessory Clinical Findings  CBC  Recent Labs  03/24/12 1544 03/25/12 0700  WBC 9.8 8.6  HGB 12.8 11.3*  HCT 37.0 33.2*  MCV 93.7 94.1  PLT 190 197   Basic Metabolic Panel  Recent Labs  03/24/12 1544 03/25/12 0700  NA 136 138  K 4.1 4.4  CL 104 106  CO2 19 21  GLUCOSE 87 78  BUN 29* 27*  CREATININE 1.18* 1.18*  CALCIUM 9.5 8.8   Cardiac Enzymes  Recent Labs  03/24/12 1544  TROPONINI 11.74*   D-Dimer  Recent Labs  03/24/12 1544  DDIMER 0.38    TELE - sinus 03/26/2012   ECG - 2/22  SR anterior MI ST elevation resolved  Radiology/Studies  Dg Chest 2 View  03/24/2012   IMPRESSION: No active disease.  Probable chronic mild interstitial prominence. Degenerative changes thoracic spine.     ASSESSMENT AND PLAN Savannah [name] is a 79 y.o. female with PMHx s/f atypical chest pain, carotid artery disease (60-79% L ICA), PVD, type 2 DM, HTN, HLD, dementia, anxiety/depression, panic  attacks, fibromyalgia, GERD who was transferred from her PCP's office to [facility] ED for chest pain with EKG changes on 03/24/12.  # NSTEMI : day 2  s/p PCI with DES x3 to mid LAD; On cath ,RCA was totally occluded but with collaterals, and thus no interventions. -ASA, Brilinta, atorvastatin, restart home dose metoprolol -Discontinue estradiol  #Acute  Systolic CHF: Echo with EF 35-40% Check BMET in am and consider resuming low dose lasix  # Type 2 DM A1c = 5.9 on 12/09/11; home regimen includes metformin & lantus 35u qHS and lispro 5u BIDWC -Home lantus continued with SSI -metformin held  # HTN : Home regimen includes lasix 20mg  daily, lisinopril 10mg  daily, metoprolol XL 50mg  daily -restart lisinopril  # HLD - statin started, check lipid panel  # Carotid artery disease : left carotid bruit with known 60-79% LICA followed by Dr Myrtis Ser # PVD  # Dementia  #  Anxiety/depression - continue home sertraline # GERD # Dispo: pending clinical improvement, consider transfer to tele   Probably discharge in am with outpatient f/u Dr Myrtis Ser  Signed, Charlton Haws MD, PGY2

## 2012-03-27 LAB — BASIC METABOLIC PANEL
BUN: 37 mg/dL — ABNORMAL HIGH (ref 6–23)
CO2: 22 mEq/L (ref 19–32)
Chloride: 104 mEq/L (ref 96–112)
Creatinine, Ser: 1.34 mg/dL — ABNORMAL HIGH (ref 0.50–1.10)
Glucose, Bld: 89 mg/dL (ref 70–99)

## 2012-03-27 LAB — GLUCOSE, CAPILLARY
Glucose-Capillary: 91 mg/dL (ref 70–99)
Glucose-Capillary: 94 mg/dL (ref 70–99)
Glucose-Capillary: 85 mg/dL (ref 70–99)

## 2012-03-27 NOTE — Progress Notes (Signed)
Clinical Social Work Department BRIEF PSYCHOSOCIAL ASSESSMENT 03/27/2012  Patient:  Savannah [name], Savannah [name]     Account Number:  [id number]     Admit date:  03/24/2012  Clinical Social Worker:  Robin Searing  Date/Time:  03/27/2012 04:08 PM  Referred by:  Physician  Date Referred:  03/27/2012 Referred for  SNF Placement   Other Referral:   Interview type:  Other - See comment Other interview type:   Met with patient, husband and 2 daughters    PSYCHOSOCIAL DATA Living Status:  FAMILY Admitted from facility:   Level of care:   Primary support name:  husband and daughters Primary support relationship to patient:  FAMILY Degree of support available:   good    CURRENT CONCERNS Current Concerns  Post-Acute Placement   Other Concerns:    SOCIAL WORK ASSESSMENT / PLAN Family reports the patient's dementia has begun to become a great challenge for them at home. She lives with her husband of 56 years and he has gait problems so is limited to how much he can do- the patient has apparently become combative with him, forgotten the stove/oven were on, lost things including medicines, as has attempted to drive. She hides/hords food and other items and has even removed her patches at times. She is a diabetic and her husband has a difficult time managing this given her memory issues, defiant behaviors and stubbornness. Per family she has been seen by a Psychiatrist and a LCSW in the community for dementia and behavioral modification/treatment.   Assessment/plan status:  Other - See comment Other assessment/ plan:   Met with patient and "proposed" a short stay in a SNF for "heart rehab" and she was agreeable however quickly forgot our conversation and when reiterated several times she would ask questions but did agree- family concerned tomorrow she wont remember.......   Information/referral to community resources:   SNF  ALF  [facility]  [facility]    PATIENT'S/FAMILY'S RESPONSE TO PLAN  OF CARE: Family appears exhasuted with attempts to care for patient in the home and its bearing on the husbands health and overall quality of life- as well as their own-    We will proceed with SNF plans and discuss again with patient tomorrow.   Reece Levy, MSW, [facility]ors [phone]

## 2012-03-27 NOTE — Progress Notes (Signed)
CARDIAC REHAB PHASE I   PRE:  Rate/Rhythm: 71SR  BP:  Supine:   Sitting: 133/78  Standing:    SaO2: 99-100%RA  MODE:  Ambulation: 550 ft   POST:  Rate/Rhythem: 85SR  BP:  Supine:   Sitting: 120/71  Standing:    SaO2: 100%RA 1058-1120 Pt walked 550 ft on RA with gait belt and asst x 2 with steady gait. Tolerated well. Encouraged pt to stay close to walker during walk. No c/o CP. Back to sitting on side of bed after walk. Husband and daughters in room. Can be asst x 1 next walk.  Savannah [name]

## 2012-03-27 NOTE — Progress Notes (Signed)
   Patient Name: Savannah [name]      SUBJECTIVE: presented with stuttering CP and + Tn>> urgent cath>>LAD80 prox 95%mid>>DESX3;  EF 40%  RCA T with collaterals Post PCI course notable for confusion  Also Hx of DM, Carotid disease, GERD and HTn   C/o chest pain since yesterday. Upset that she had heart attack, not aware that she has caroitd disease for which she sees Dr Melvenia Needles Some epistaxis    Past Medical History  Diagnosis Date  . Carotid artery disease     Doppler,  April, 2013, 0-39% R. ICA,  60-79% LICA.  Some progression on the left, plan followup 6 months  . Chest pain     Nuclear, November, 2010, no ischemia, normal ejection fraction  . Ejection fraction     EF normal, nuclear, 2010  . GERD (gastroesophageal reflux disease)   . Cough     May be related to GERD  . Alzheimer's dementia   . Diabetes mellitus     insulin dependent  . Anxiety     PHYSICAL EXAM Filed Vitals:   03/26/12 2131 03/26/12 2300 03/26/12 2319 03/27/12 0617  BP: 93/56 100/67 107/67 114/73  Pulse: 70  80 69  Temp: 99.8 F (37.7 C)   99 F (37.2 C)  TempSrc: Oral   Oral  Resp: 18   18  Height:      Weight:      SpO2: 95%   96%   Well developed and nourished in no acute distress HENT normal Neck supple with JVP-flat Clear Regular rate and rhythm, no murmurs or gallops Abd-soft with active BS No Clubbing cyanosis edema Skin-warm and dry A & Oriented  confusted  Grossly normal sensory and motor function  TELEMETRY: Reviewed telemetry pt in NSR:   No intake or output data in the 24 hours ending 03/27/12 0745  LABS: Basic Metabolic Panel:  Recent Labs Lab 03/24/12 1544 03/25/12 0700  NA 136 138  K 4.1 4.4  CL 104 106  CO2 19 21  GLUCOSE 87 78  BUN 29* 27*  CREATININE 1.18* 1.18*  CALCIUM 9.5 8.8   Cardiac Enzymes:  Recent Labs  03/24/12 1544  TROPONINI 11.74*   CBC:  Recent Labs Lab 03/24/12 1544 03/25/12 0700  WBC 9.8 8.6  HGB 12.8 11.3*  HCT 37.0 33.2*    MCV 93.7 94.1  PLT 190 197   Recent Labs  03/24/12 1544  DDIMER 0.38     ASSESSMENT AND PLAN:  Principal Problem:   NSTEMI (non-ST elevated myocardial infarction) Active Problems:   Carotid artery disease   GERD (gastroesophageal reflux disease)   Type II or unspecified type diabetes mellitus without mention of complication, not stated as uncontrolled   Essential hypertension, benign   PVD (peripheral vascular disease)   Coronary artery disease   Dementia with behavioral disturbance   Other and unspecified hyperlipidemia   Pt has chest pain post procedur, but hard to interpret with dementia.  Will get 12 lead and cycle troponin. SOc work consult for discharge planning, apparently dementia and home situation are a challenge Hopefully discharge in am  Signed, Sherryl Manges MD  03/27/2012

## 2012-03-28 ENCOUNTER — Telehealth: Payer: Self-pay | Admitting: Cardiology

## 2012-03-28 LAB — GLUCOSE, CAPILLARY
Glucose-Capillary: 101 mg/dL — ABNORMAL HIGH (ref 70–99)
Glucose-Capillary: 115 mg/dL — ABNORMAL HIGH (ref 70–99)

## 2012-03-28 LAB — TROPONIN I: Troponin I: 2.87 ng/mL (ref <0.30)

## 2012-03-28 NOTE — Progress Notes (Signed)
Late entry - Pts BNP 10374 2/24, clinically pt stable, VSS, pt in no distress without complaints.  Dr. Tresa Endo notified at 2147 last pm with no new orders.  Will continue to monitor.

## 2012-03-28 NOTE — Progress Notes (Signed)
CARDIAC REHAB PHASE I   PRE:  Rate/Rhythm: 81 SR  BP:  Supine:   Sitting: 111/71  Standing:    SaO2: 98 RA  MODE:  Ambulation: 550 ft   POST:  Rate/Rhythem: 87  BP:  Supine:   Sitting: 94/60  Standing:    SaO2: 100 RA 1420-1455 Assisted X 1 and used walker to ambulate. Gait steady with walker. VS stable. Pt c/o of back pain and joint pain "all over". Pt to recliner after walk with chair alarm on and call light in reach. Pt's conversation very hard to follow today, disoriented. RN aware that pt in recliner. Savannah [name]

## 2012-03-28 NOTE — Telephone Encounter (Signed)
New Prob   Calling to report critical lab: 3.55 troponin level

## 2012-03-28 NOTE — Care Management Note (Signed)
    Page 1 of 1   03/28/2012     10:46:00 AM   CARE MANAGEMENT NOTE 03/28/2012  Patient:  Savannah [name], Savannah [name]   Account Number:  [id number]  Date Initiated:  03/28/2012  Documentation initiated by:  GRAVES-BIGELOW,Nyan Dufresne  Subjective/Objective Assessment:   Pt admitted with Nstemi. Post cardiac cath.     Action/Plan:   The plan is for SNF at d/c. CSW is working with pt and family with disposition needs. No further needs from CM at this time.   Anticipated DC Date:  03/28/2012   Anticipated DC Plan:  SKILLED NURSING FACILITY  In-house referral  Clinical Social Worker      DC Planning Services  CM consult      Choice offered to / List presented to:             Status of service:  Completed, signed off Medicare Important Message given?   (If response is "NO", the following Medicare IM given date fields will be blank) Date Medicare IM given:   Date Additional Medicare IM given:    Discharge Disposition:  SKILLED NURSING FACILITY  Per UR Regulation:  Reviewed for med. necessity/level of care/duration of stay  If discussed at Long Length of Stay Meetings, dates discussed:    Comments:

## 2012-03-28 NOTE — Progress Notes (Signed)
SNF bed offered and accepted at [facility] for patient tomorrow- FL2 in paperchart for MD signature. 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    Sherald Barge, LCSWA [phone]

## 2012-03-28 NOTE — Telephone Encounter (Signed)
Patient is an inpatient. Dr Shirlee Latch notified of result. He will not be following patient on discharge.

## 2012-03-28 NOTE — Progress Notes (Signed)
   Patient Name: Savannah [name]      SUBJECTIVE: presented with stuttering CP and + Tn>> urgent cath>>LAD80 prox 95%mid>>DESX3;  EF 40%  RCA T with collaterals Post PCI course notable for confusion  Also Hx of DM, Carotid disease, GERD and HTn   No complaints byt today is disoriented        Past Medical History  Diagnosis Date  . Carotid artery disease     Doppler,  April, 2013, 0-39% R. ICA,  60-79% LICA.  Some progression on the left, plan followup 6 months  . Chest pain     Nuclear, November, 2010, no ischemia, normal ejection fraction  . Ejection fraction     EF normal, nuclear, 2010  . GERD (gastroesophageal reflux disease)   . Cough     May be related to GERD  . Alzheimer's dementia   . Diabetes mellitus     insulin dependent  . Anxiety     PHYSICAL EXAM Filed Vitals:   03/27/12 0617 03/27/12 1330 03/27/12 2137 03/28/12 0625  BP: 114/73 95/51 116/59 102/53  Pulse: 69 69 70 72  Temp: 99 F (37.2 C) 98.7 F (37.1 C) 98.5 F (36.9 C) 99.2 F (37.3 C)  TempSrc: Oral Oral Oral   Resp: 18 18 18 18   Height:      Weight:      SpO2: 96% 99% 98% 95%   Well developed and nourished in no acute distress HENT normal Neck supple with JVP-flat Clear Regular rate and rhythm, no murmurs or gallops Abd-soft with active BS No Clubbing cyanosis edema Skin-warm and dry A & Oriented x 1  confusted  Grossly normal sensory and motor function  TELEMETRY: Reviewed telemetry pt in NSR:    Intake/Output Summary (Last 24 hours) at 03/28/12 0901 Last data filed at 03/28/12 1610  Gross per 24 hour  Intake    240 ml  Output      0 ml  Net    240 ml    LABS: Basic Metabolic Panel:  Recent Labs Lab 03/24/12 1544 03/25/12 0700 03/27/12 0700  NA 136 138 137  K 4.1 4.4 4.4  CL 104 106 104  CO2 19 21 22   GLUCOSE 87 78 89  BUN 29* 27* 37*  CREATININE 1.18* 1.18* 1.34*  CALCIUM 9.5 8.8 9.1   Cardiac Enzymes: No results found for this basename: CKTOTAL,  CKMB, CKMBINDEX, TROPONINI,  in the last 72 hours CBC:  Recent Labs Lab 03/24/12 1544 03/25/12 0700  WBC 9.8 8.6  HGB 12.8 11.3*  HCT 37.0 33.2*  MCV 93.7 94.1  PLT 190 197  No results found for this basename: DDIMER,  in the last 72 hours   ASSESSMENT AND PLAN:  Principal Problem:   NSTEMI (non-ST elevated myocardial infarction) Active Problems:   Carotid artery disease   GERD (gastroesophageal reflux disease)   Type II or unspecified type diabetes mellitus without mention of complication, not stated as uncontrolled   Essential hypertension, benign   PVD (peripheral vascular disease)   Coronary artery disease   Dementia with behavioral disturbance   Other and unspecified hyperlipidemia   Pt has chest pain post procedur, but hard to interpret with dementia.    Soc Work >>SNF  Is dementia at baseline   Do we need med input for this Signed, Sherryl Manges MD  03/28/2012

## 2012-03-28 NOTE — Progress Notes (Signed)
Clinical Social Work Department CLINICAL SOCIAL WORK PLACEMENT NOTE 03/28/2012  Patient:  JAMAIYA, TUNNELL  Account Number:  [id number] Admit date:  03/24/2012  Clinical Social Worker:  Kirke Shaggy  Date/time:  03/28/2012 11:51 AM  Clinical Social Work is seeking post-discharge placement for this patient at the following level of care:   SKILLED NURSING   (*CSW will update this form in Epic as items are completed)   03/28/2012  Patient/family provided with [facility] Department of Clinical Social Work's list of facilities offering this level of care within the geographic area requested by the patient (or if unable, by the patient's family).  03/28/2012  Patient/family informed of their freedom to choose among providers that offer the needed level of care, that participate in Medicare, Medicaid or managed care program needed by the patient, have an available bed and are willing to accept the patient.  03/28/2012  Patient/family informed of [facility]' ownership interest in [facility], as well as of the fact that they are under no obligation to receive care at this facility.  PASARR submitted to EDS on 03/28/2012 PASARR number received from EDS on   FL2 transmitted to all facilities in geographic area requested by pt/family on  03/28/2012 FL2 transmitted to all facilities within larger geographic area on   Patient informed that his/her managed care company has contracts with or will negotiate with  certain facilities, including the following:     Patient/family informed of bed offers received:   Patient chooses bed at  Physician recommends and patient chooses bed at    Patient to be transferred to  on   Patient to be transferred to facility by   The following physician request were entered in Epic:   Additional Comments: Sherald Barge, LCSW-A [phone]

## 2012-03-29 ENCOUNTER — Encounter (HOSPITAL_COMMUNITY): Payer: Self-pay | Admitting: Cardiology

## 2012-03-29 MED ORDER — ASPIRIN 81 MG PO TABS: 81.0000 mg | ORAL_TABLET | Freq: Every day | ORAL | Status: DC

## 2012-03-29 MED ORDER — ATORVASTATIN CALCIUM 40 MG PO TABS: 40.0000 mg | ORAL_TABLET | Freq: Every day | ORAL | Status: DC

## 2012-03-29 MED ORDER — TICAGRELOR 90 MG PO TABS: 90.0000 mg | ORAL_TABLET | Freq: Two times a day (BID) | ORAL | Status: DC

## 2012-03-29 NOTE — Progress Notes (Signed)
Patient for d/c today to SNF bed at  [facility]. Family and patient agreeable to this plan- plan transfer via EMS. Reece Levy, MSW, [facility]ors [phone]

## 2012-03-29 NOTE — Progress Notes (Signed)
  Patient Name: Savannah [name]      SUBJECTIVE: anxiiuos to go home and denies chestpain  Past Medical History  Diagnosis Date  . Carotid artery disease     Doppler,  April, 2013, 0-39% R. ICA,  60-79% LICA.  Some progression on the left, plan followup 6 months  . Chest pain     Nuclear, November, 2010, no ischemia, normal ejection fraction  . Ejection fraction     EF normal, nuclear, 2010  . GERD (gastroesophageal reflux disease)   . Cough     May be related to GERD  . Alzheimer's dementia   . Diabetes mellitus     insulin dependent  . Anxiety     PHYSICAL EXAM Filed Vitals:   03/28/12 1009 03/28/12 1400 03/28/12 2027 03/29/12 0500  BP: 113/73 113/69 104/52 135/77  Pulse: 75 72 72 72  Temp:  99.2 F (37.3 C) 98.6 F (37 C) 98 F (36.7 C)  TempSrc:  Oral Oral   Resp:  19 20 18   Height:      Weight:      SpO2:  98% 97% 98%    Well developed and nourished in no acute distress HENT normal Neck supple  Clear Regular rate and rhythm,   Abd-soft   No Clubbing cyanosis edema Skin-warm and dry A    Grossly normal sensory and motor function  TELEMETRY: Reviewed telemetry pt in NSR:    Intake/Output Summary (Last 24 hours) at 03/29/12 0831 Last data filed at 03/28/12 1700  Gross per 24 hour  Intake    480 ml  Output      0 ml  Net    480 ml    LABS: Basic Metabolic Panel:  Recent Labs Lab 03/24/12 1544 03/25/12 0700 03/27/12 0700  NA 136 138 137  K 4.1 4.4 4.4  CL 104 106 104  CO2 19 21 22   GLUCOSE 87 78 89  BUN 29* 27* 37*  CREATININE 1.18* 1.18* 1.34*  CALCIUM 9.5 8.8 9.1   Cardiac Enzymes:  Recent Labs  03/28/12 0914 03/28/12 1518 03/28/12 2054  TROPONINI 3.54* 2.80* 2.87*   CBC:  Recent Labs Lab 03/24/12 1544 03/25/12 0700  WBC 9.8 8.6  HGB 12.8 11.3*  HCT 37.0 33.2*  MCV 93.7 94.1  PLT 190 197    BNP (last 3 results)  Recent Labs  03/27/12 0700  PROBNP 10374.0*      ASSESSMENT AND PLAN:  Principal Problem:  NSTEMI (non-ST elevated myocardial infarction) Active Problems:   Carotid artery disease   GERD (gastroesophageal reflux disease)   Type II or unspecified type diabetes mellitus without mention of complication, not stated as uncontrolled   Essential hypertension, benign   PVD (peripheral vascular disease)   Coronary artery disease   Dementia with behavioral disturbance   Other and unspecified hyperlipidemia  Ready to go home  To snf On BB, ASA, ACE  and brelinta F/u with JK3-4 weeks     Signed, Sherryl Manges MD  03/29/2012

## 2012-03-29 NOTE — Discharge Summary (Signed)
CARDIOLOGY DISCHARGE SUMMARY    Patient ID: Savannah [name],  MRN: [id number], DOB/AGE: 1933/02/26 79 y.o.  Admit date: 03/24/2012 Discharge date: 03/29/2012  Primary Care Physician: Chilton Greathouse, MD   Primary Cardiologist: Willa Rough, MD  Primary Discharge Diagnosis:  1. NSTEMI/CAD s/p cardiac cath with PTCA/DES to mid LAD  Secondary Discharge Diagnoses:  Carotid artery disease  GERD (gastroesophageal reflux disease)  Type II or unspecified type diabetes mellitus without mention of complication, not stated as uncontrolled  Essential hypertension PVD (peripheral vascular disease)  Dementia with behavioral disturbance Dyslipidemia Fibromyalgia  Procedures This Admission:  1. Left cardiac catheterization with PTCA/DES x 3 mid LAD on 03/24/2012 Procedural Findings:  Hemodynamics:  AO 100/51  LV 101/28  Coronary angiography:  Coronary dominance: right  Left mainstem: No significant disease.  Left anterior descending (LAD): 80% proximal LAD stenosis followed by a 95% thrombotic mid LAD stenosis with TIMI 2 flow beyond the stenosis.  Left circumflex (LCx): Small branching ramus with 50% ostial stenosis. 60% mLCx.  Right coronary artery (RCA): Total occlusion of the distal RCA with left to right collaterals.  Left ventriculography: LVEF 40% with akinesis of the mid to apical anterior wall, the apex, and the mid to apical inferior wall. The basal segments are hyperkinetic.  Final Conclusions: Suspect that the LAD thrombotic lesion is the culprit lesion for Savannah [name] current presentation. The distal RCA is occluded with collaterals from the left. We will treat the LAD via PCI and manage the RCA medically.  PCI notes: Impression: Successful PTCA/DES x 3 mid LAD  Recommendations: She will need ASA and Brilinta for one year. Will continue beta blocker and statin.   History: Please see admission H&P for full details. Savannah [name] is a 79 year old woman with carotid artery disease  (60-79% L ICA), PVD, type 2 DM, HTN, HLD, dementia, anxiety/depression, panic attacks, fibromyalgia and GERD who was transferred from Savannah [name] PCP's office to [facility] ED for chest pain with EKG changes. She had a prior normal Lexiscan Myoview in 2010 - low risk with small area of ischemia in the distal anterior wall/apex (breast attentuation could not be excluded). She has followed up with Dr. Myrtis Ser most recently in 05/2011 at which time she was deemed stable from a cardiac perspective. In the ED Savannah [name] history was vague due to dementia. She reported experiencing intermittent substernal chest pain x 10 days, both at rest and with exertion. Savannah [name] husband also noticed new dyspnea on exertion during this time. She denied orthopnea, PND, LE edema, palpitations or syncope. No fevers, chills or active bleeding.  ECG from Savannah [name] PCP's office revealed nonspecific anterior ST abnormalities, new anterolateral Q waves, old inferior Q waves. Trop-I returned elevated at 11.74. D-dimer WNL. U/a WNL. BMET- BUN 19, Cr 1.18. CBC unremarkable. CXR- no active disease, probable chronic mild interstitial prominence, degenerative thoracic spine changes. Therefore she was admitted for management of NSTEMI.  Hospital Course:  Savannah [name] was admitted to CCU and underwent cardiac cath with PCI to LAD, as outlined above. An echocardiogram was done revealing moderately dilated LV with an EF of 35-40% and distal anterior wall apical, inferoapical and septal akinesis, mild MR, mildly dilated LA, trivial pericardial effusion and PASP 48 mmHg. She was intermittently confused (consistent with Savannah [name] dementia) while here but remained hemodynamically stable. She did not have any sustained arrhythmias on telemetry. She was scheduled for DC on 03/27/2012; however, she developed chest pain which postponed Savannah [name] DC. Savannah [name] 12-lead ECG did not show any acute  ST-T wave abnormalities and Savannah [name] CEs were cycled again and were trending down from peak of 11.74. Savannah [name] CP was hard to  interpret given Savannah [name] dementia but did resolve yesterday. She was continued on medical therapy for CAD. She was evaluated by cardiac rehab, social work and case management. SNF placement was recommended. She has been seen, examined and deemed stable for DC to SNF today by Dr. Sherryl Manges.   Discharge Vitals: Blood pressure 103/49, pulse 71, temperature 98.6 F (37 C), temperature source Oral, resp. rate 18, height 5\' 7"  (1.702 m), weight 209 lb 7 oz (95 kg), SpO2 100.00%.   Labs: Lab Results  Component Value Date   WBC 8.6 03/25/2012   HGB 11.3* 03/25/2012   HCT 33.2* 03/25/2012   MCV 94.1 03/25/2012   PLT 197 03/25/2012     Recent Labs Lab 03/27/12 0700  NA 137  K 4.4  CL 104  CO2 22  BUN 37*  CREATININE 1.34*  CALCIUM 9.1  GLUCOSE 89   Lab Results  Component Value Date   CKTOTAL 191* 12/10/2011   CKMB 4.0 12/10/2011   TROPONINI 2.87* 03/28/2012    Lab Results  Component Value Date   DDIMER 0.38 03/24/2012    Disposition:  The patient is being discharged in stable condition.  Follow-up:     Follow-up Information   Follow up with Willa Rough, MD On 05/01/2012. (At 2:15 PM)    Contact information:   [address] [place] [address] [phone]    Discharge Medications:    Medication List    STOP taking these medications       furosemide 20 MG tablet  Commonly known as:  LASIX     metFORMIN 1000 MG tablet  Commonly known as:  GLUCOPHAGE     pravastatin 40 MG tablet  Commonly known as:  PRAVACHOL      TAKE these medications       aspirin 81 MG tablet  Take 1 tablet (81 mg total) by mouth daily.     atorvastatin 40 MG tablet  Commonly known as:  LIPITOR  Take 1 tablet (40 mg total) by mouth daily at 6 PM.     estradiol 1 MG tablet  Commonly known as:  ESTRACE  Take 1 mg by mouth daily.     EXELON 13.3 MG/24HR Pt24  Generic drug:  Rivastigmine  Place 13.3 mg onto the skin daily.     insulin glargine 100 UNIT/ML injection    Commonly known as:  LANTUS  Inject 35 Units into the skin daily.     insulin lispro 100 UNIT/ML injection  Commonly known as:  HUMALOG  Inject 5 Units into the skin 2 (two) times daily.     lisinopril 10 MG tablet  Commonly known as:  PRINIVIL,ZESTRIL  Take 10 mg by mouth daily.     metoprolol succinate 50 MG 24 hr tablet  Commonly known as:  TOPROL-XL  Take 50 mg by mouth daily. Take with or immediately following a meal.     NAMENDA XR 28 MG Cp24  Generic drug:  Memantine HCl ER  Take 28 mg by mouth daily.     sertraline 100 MG tablet  Commonly known as:  ZOLOFT  Take 100 mg by mouth daily.     Ticagrelor 90 MG Tabs tablet  Commonly known as:  BRILINTA  Take 1 tablet (90 mg total) by mouth 2 (two) times daily.      Duration of  Discharge Encounter: Greater than 30 minutes including physician time.  Signed, Rick Duff, PA-C 03/29/2012, 3:01 PM

## 2012-04-03 ENCOUNTER — Telehealth: Payer: Self-pay | Admitting: Cardiology

## 2012-04-03 NOTE — Telephone Encounter (Signed)
No answer/unable to leave message @3 /3/14 2:40pm - had attempted to call patient to provide her with number to call for MyChart assistance since her account has already been activated, as of 12/11/2011.

## 2012-04-03 NOTE — Telephone Encounter (Signed)
New Prob    Pt calling requesting a MyChart access code. Would like to speak to nurse.

## 2012-04-05 NOTE — Telephone Encounter (Signed)
Savannah [name] was notified that [name] signed up for mychart in Nov.  He was given the phone number for technical assistance.

## 2012-04-29 ENCOUNTER — Encounter: Payer: Self-pay | Admitting: Cardiology

## 2012-05-01 ENCOUNTER — Ambulatory Visit (INDEPENDENT_AMBULATORY_CARE_PROVIDER_SITE_OTHER): Payer: Medicare Other | Admitting: Cardiology

## 2012-05-01 ENCOUNTER — Encounter: Payer: Self-pay | Admitting: Cardiology

## 2012-05-01 ENCOUNTER — Telehealth: Payer: Self-pay | Admitting: Cardiology

## 2012-05-01 VITALS — BP 96/48 | HR 62 | Ht 66.0 in | Wt 206.0 lb

## 2012-05-01 DIAGNOSIS — R9430 Abnormal result of cardiovascular function study, unspecified: Secondary | ICD-10-CM

## 2012-05-01 DIAGNOSIS — F0391 Unspecified dementia with behavioral disturbance: Secondary | ICD-10-CM

## 2012-05-01 DIAGNOSIS — R0989 Other specified symptoms and signs involving the circulatory and respiratory systems: Secondary | ICD-10-CM

## 2012-05-01 DIAGNOSIS — I2510 Atherosclerotic heart disease of native coronary artery without angina pectoris: Secondary | ICD-10-CM

## 2012-05-01 DIAGNOSIS — I959 Hypotension, unspecified: Secondary | ICD-10-CM | POA: Insufficient documentation

## 2012-05-01 DIAGNOSIS — I779 Disorder of arteries and arterioles, unspecified: Secondary | ICD-10-CM

## 2012-05-01 NOTE — Patient Instructions (Addendum)
Your physician recommends that you schedule a follow-up appointment in: 8 weeks  Your physician recommends that you continue on your current medications as directed. Please refer to the Current Medication list given to you today.   

## 2012-05-01 NOTE — Progress Notes (Signed)
HPI  The patient is seen for followup recent hospitalization for myocardial infarction. I take care of her husband for many many years. I have seen the patient in the past. She had a nuclear scan in 2010 which showed no evidence of ischemia. Recently she had marked nausea. Eventually it became clear that she was having a stuttering anterior infarct. She was catheterized immediately and she received stents to the LAD. She did have significant anterior wall motion abnormality with this. Echo showed ejection fraction in the 35-40% range around the time of the acute infarct.  The patient has significant dementia and lives at [facility]. She's here with her nephew and her husband. We took the history together. She is not having syncope or presyncope. She's not having any significant signs of CHF.  It is noted that her systolic pressure is 98 today. She is not having any significant symptoms.  Allergies  Allergen Reactions  . Altace (Ramipril)     Current Outpatient Prescriptions  Medication Sig Dispense Refill  . aspirin 81 MG tablet Take 1 tablet (81 mg total) by mouth daily.      Marland Kitchen atorvastatin (LIPITOR) 40 MG tablet Take 1 tablet (40 mg total) by mouth daily at 6 PM.  30 tablet  11  . donepezil (ARICEPT) 5 MG tablet Take 5 mg by mouth at bedtime.      Marland Kitchen estradiol (ESTRACE) 1 MG tablet Take 1 mg by mouth daily.      . insulin glargine (LANTUS) 100 UNIT/ML injection Inject 33 Units into the skin daily.       . insulin lispro (HUMALOG) 100 UNIT/ML injection Inject 5 Units into the skin 2 (two) times daily.      Marland Kitchen lisinopril (PRINIVIL,ZESTRIL) 10 MG tablet Take 10 mg by mouth daily.      Marland Kitchen LORazepam (ATIVAN) 0.5 MG tablet Take 0.5 mg by mouth every 8 (eight) hours as needed for anxiety.      Marland Kitchen LORazepam (ATIVAN) 0.5 MG tablet Take 0.5 mg by mouth 2 (two) times daily.      . Memantine HCl ER (NAMENDA XR) 28 MG CP24 Take 28 mg by mouth daily.      . metoprolol succinate (TOPROL-XL) 50 MG 24 hr  tablet Take 50 mg by mouth daily. Take with or immediately following a meal.      . Rivastigmine (EXELON) 13.3 MG/24HR PT24 Place 13.3 mg onto the skin daily.      . sertraline (ZOLOFT) 100 MG tablet Take 150 mg by mouth daily.       . Ticagrelor (BRILINTA) 90 MG TABS tablet Take 1 tablet (90 mg total) by mouth 2 (two) times daily.  60 tablet  11  . zolpidem (AMBIEN) 10 MG tablet Take 10 mg by mouth at bedtime as needed for sleep.       No current facility-administered medications for this visit.    History   Social History  . Marital Status: Married    Spouse Name: N/A    Number of Children: N/A  . Years of Education: N/A   Occupational History  . Not on file.   Social History Main Topics  . Smoking status: Former Smoker    Quit date: 12/09/1971  . Smokeless tobacco: Never Used  . Alcohol Use: No  . Drug Use: No  . Sexually Active: No   Other Topics Concern  . Not on file   Social History Narrative  . No narrative on file  No family history on file.  Past Medical History  Diagnosis Date  . Carotid artery disease     Doppler,  April, 2013, 0-39% R. ICA,  60-79% LICA.  Some progression on the left, plan followup 6 months  . CAD (coronary artery disease)     s/p NSTEMI Feb 2014 - PTCA/PCI with DES to mid-LAD  . Ejection fraction   . GERD (gastroesophageal reflux disease)   . Cough     May be related to GERD  . Alzheimer's dementia   . Diabetes mellitus     insulin dependent  . Anxiety   . Mitral regurgitation     Mild, echo, February, 2014    Past Surgical History  Procedure Laterality Date  . Cholecystectomy      Patient Active Problem List  Diagnosis  . Carotid artery disease  . CAD (coronary artery disease)  . Ejection fraction  . GERD (gastroesophageal reflux disease)  . Type II or unspecified type diabetes mellitus without mention of complication, not stated as uncontrolled  . Cough  . Headache  . Nausea with vomiting  . NSTEMI (non-ST  elevated myocardial infarction)  . Essential hypertension, benign  . PVD (peripheral vascular disease)  . Dementia with behavioral disturbance  . Other and unspecified hyperlipidemia    ROS   Patient denies fever, chills, headache, sweats, rash, change in vision, change in hearing, chest pain, cough, nausea vomiting, urinary symptoms. All other systems are reviewed and are negative. This review of systems was taken with the patient and her family. She has significant dementia.  PHYSICAL EXAM   The patient is oriented to person. She is here with her husband and nephew. There is no jugulovenous distention. Lungs are clear. Respiratory effort is nonlabored. Cardiac exam reveals S1 and S2. There no clicks or significant murmurs. The abdomen is soft. Is no peripheral edema. There no musculoskeletal deformities. There no skin rashes.  Filed Vitals:   05/01/12 1428  BP: 96/48  Pulse: 62  Height: 5\' 6"  (1.676 m)  Weight: 206 lb (93.441 kg)   EKG is done today and reviewed by me. There is sinus rhythm. There are inferior Q waves. There is decreasing anterior R-wave progression and diffuse T-wave inversions.  ASSESSMENT & PLAN

## 2012-05-01 NOTE — Assessment & Plan Note (Signed)
There is relative hypotension. There is no symptoms. She is stable. No change in therapy.

## 2012-05-01 NOTE — Assessment & Plan Note (Signed)
The patient does have known carotid disease. This is being followed carefully by Doppler.

## 2012-05-01 NOTE — Assessment & Plan Note (Signed)
The patient had significant development of LV dysfunction with her acute event. Her blood pressure is low. We cannot use any higher doses than the low dose of metoprolol and lisinopril that she is on. I've chosen not to lower these. Her pressure is low but she is having no significant symptoms. I explained this to the family.

## 2012-05-01 NOTE — Telephone Encounter (Signed)
New problem    Per pts daughter prior to pts heart attack she has dementia & alzheimer's. [facility]ana Nakya determined not safe for pt to return home so pt is at [facility]/ pt can  & may ask dr Myrtis Ser if she's well enough to go home/but family feels that's not a good idea

## 2012-05-01 NOTE — Assessment & Plan Note (Signed)
The patient had a significant coronary event receiving 3 drug-eluting stents to the mid LAD February 2 014. She did have significant LV dysfunction with the acute event. I explained to the family that I am hoping that she will have some return of function. This will not be known for time. We will not proceed with an echo yet. For now she will remain on aspirin and Brilinta. I will decided each visit if this can be changed aspirin and Plavix.

## 2012-06-30 ENCOUNTER — Ambulatory Visit: Payer: Medicare Other | Admitting: Cardiology

## 2012-07-03 ENCOUNTER — Encounter: Payer: Self-pay | Admitting: Cardiology

## 2012-07-03 ENCOUNTER — Ambulatory Visit (INDEPENDENT_AMBULATORY_CARE_PROVIDER_SITE_OTHER): Payer: Medicare Other | Admitting: Cardiology

## 2012-07-03 ENCOUNTER — Encounter (INDEPENDENT_AMBULATORY_CARE_PROVIDER_SITE_OTHER): Payer: Medicare Other

## 2012-07-03 VITALS — BP 120/70 | HR 59 | Ht 66.0 in | Wt 206.4 lb

## 2012-07-03 DIAGNOSIS — I2589 Other forms of chronic ischemic heart disease: Secondary | ICD-10-CM

## 2012-07-03 DIAGNOSIS — F0391 Unspecified dementia with behavioral disturbance: Secondary | ICD-10-CM

## 2012-07-03 DIAGNOSIS — I255 Ischemic cardiomyopathy: Secondary | ICD-10-CM | POA: Insufficient documentation

## 2012-07-03 DIAGNOSIS — I2510 Atherosclerotic heart disease of native coronary artery without angina pectoris: Secondary | ICD-10-CM

## 2012-07-03 DIAGNOSIS — I6529 Occlusion and stenosis of unspecified carotid artery: Secondary | ICD-10-CM

## 2012-07-03 DIAGNOSIS — I779 Disorder of arteries and arterioles, unspecified: Secondary | ICD-10-CM

## 2012-07-03 DIAGNOSIS — F03918 Unspecified dementia, unspecified severity, with other behavioral disturbance: Secondary | ICD-10-CM

## 2012-07-03 MED ORDER — LISINOPRIL 20 MG PO TABS: 20.0000 mg | ORAL_TABLET | Freq: Every day | ORAL | Status: DC

## 2012-07-03 MED ORDER — CLOPIDOGREL BISULFATE 75 MG PO TABS: 75.0000 mg | ORAL_TABLET | Freq: Every day | ORAL | Status: AC

## 2012-07-03 NOTE — Patient Instructions (Addendum)
Stop Brilanta   Start Plavix 75 mg daily    Increase Lisinopril 20 mg daily   Your physician wants you to follow-up in: 6 months You will receive a reminder letter in the mail two months in advance. If you don't receive a letter, please call our office to schedule the follow-up appointment.

## 2012-07-03 NOTE — Assessment & Plan Note (Signed)
Coronary disease is stable. We can change her Brilinta to Plavix.

## 2012-07-03 NOTE — Progress Notes (Signed)
HPI  Patient is seen for followup coronary disease. She had a non-STEMI in February, 2014. She did have some left ventricular dysfunction with an EF of 35-40%. When I saw her last her blood pressure was on the low side and I was not able to titrate her meds any further. She is on a beta blocker. She is on an ACE inhibitor.  The patient has significant dementia and continues to live at [facility]. She is here today as both her husband and son helped to bring her.  Allergies  Allergen Reactions  . Altace (Ramipril)     Current Outpatient Prescriptions  Medication Sig Dispense Refill  . aspirin 81 MG tablet Take 1 tablet (81 mg total) by mouth daily.      Marland Kitchen atorvastatin (LIPITOR) 40 MG tablet Take 1 tablet (40 mg total) by mouth daily at 6 PM.  30 tablet  11  . donepezil (ARICEPT) 5 MG tablet Take 5 mg by mouth at bedtime.      Marland Kitchen estradiol (ESTRACE) 1 MG tablet Take 1 mg by mouth daily.      . furosemide (LASIX) 20 MG tablet Take 20 mg by mouth daily.      Marland Kitchen gabapentin (NEURONTIN) 300 MG capsule Take 300 mg by mouth 2 (two) times daily.      . insulin aspart (NOVOLOG) 100 UNIT/ML injection Inject 5 Units into the skin as directed.      . insulin lispro (HUMALOG) 100 UNIT/ML injection Inject 24 Units into the skin as directed.       Marland Kitchen lisinopril (PRINIVIL,ZESTRIL) 10 MG tablet Take 10 mg by mouth daily.      Marland Kitchen LORazepam (ATIVAN) 0.5 MG tablet Take 0.5 mg by mouth 2 (two) times daily.      . Memantine HCl ER (NAMENDA XR) 28 MG CP24 Take 28 mg by mouth daily.      . metoprolol succinate (TOPROL-XL) 50 MG 24 hr tablet Take 50 mg by mouth daily. Take with or immediately following a meal.      . sertraline (ZOLOFT) 100 MG tablet Take 200 mg by mouth daily.       . Ticagrelor (BRILINTA) 90 MG TABS tablet Take 1 tablet (90 mg total) by mouth 2 (two) times daily.  60 tablet  11  . zolpidem (AMBIEN) 10 MG tablet Take 10 mg by mouth at bedtime as needed for sleep.       No current  facility-administered medications for this visit.    History   Social History  . Marital Status: Married    Spouse Name: N/A    Number of Children: N/A  . Years of Education: N/A   Occupational History  . Not on file.   Social History Main Topics  . Smoking status: Former Smoker    Quit date: 12/09/1971  . Smokeless tobacco: Never Used  . Alcohol Use: No  . Drug Use: No  . Sexually Active: No   Other Topics Concern  . Not on file   Social History Narrative  . No narrative on file    No family history on file.  Past Medical History  Diagnosis Date  . Carotid artery disease     Doppler,  April, 2013, 0-39% R. ICA,  60-79% LICA.  Some progression on the left, plan followup 6 months  . CAD (coronary artery disease)     s/p NSTEMI Feb 2014 - PTCA/PCI with DES to mid-LAD  . Ejection fraction   . GERD (  gastroesophageal reflux disease)   . Cough     May be related to GERD  . Alzheimer's dementia   . Diabetes mellitus     insulin dependent  . Anxiety   . Mitral regurgitation     Mild, echo, February, 2014    Past Surgical History  Procedure Laterality Date  . Cholecystectomy      Patient Active Problem List   Diagnosis Date Noted  . Hypotension 05/01/2012  . NSTEMI (non-ST elevated myocardial infarction) 03/25/2012  . Essential hypertension, benign 03/25/2012  . PVD (peripheral vascular disease) 03/25/2012  . Dementia with behavioral disturbance 03/25/2012  . Other and unspecified hyperlipidemia 03/25/2012  . Nausea with vomiting 12/10/2011  . Headache 12/09/2011  . Carotid artery disease   . CAD (coronary artery disease)   . Ejection fraction   . GERD (gastroesophageal reflux disease)   . Type II or unspecified type diabetes mellitus without mention of complication, not stated as uncontrolled   . Cough     ROS    The patient does not appear to be oriented. She can answer yes and no questions about how she feels. The family helps to answer the review of  systems. They deny fever, chills, headache, sweats, rash, change in vision, change in hearing, chest pain, cough, nausea vomiting, urinary symptoms. All other systems are reviewed and are negative per her family.  PHYSICAL EXAM  Patient is overweight. She is not oriented. She is here with her son her husband. There is no jugular venous distention. Lungs are clear. Respiratory effort is nonlabored. Cardiac exam reveals S1 and S2. There no clicks or significant murmurs. Abdomen is soft. There is no peripheral edema.  Filed Vitals:   07/03/12 1149  BP: 120/70  Pulse: 59  Height: 5\' 6"  (1.676 m)  Weight: 206 lb 6.4 oz (93.622 kg)     ASSESSMENT & PLAN

## 2012-07-03 NOTE — Assessment & Plan Note (Signed)
She is on an ACE inhibitor or beta blocker. The ACE inhibitor dose can be raised slightly today. Otherwise a been no change. I will consider followup 2-D echo in 6 months when I see her back.

## 2012-07-03 NOTE — Assessment & Plan Note (Signed)
Unfortunately this continues to be a problem.  She is still at [facility].

## 2012-07-03 NOTE — Assessment & Plan Note (Addendum)
She had a followup carotid Doppler earlier today. Carotids are stable. There is 0-39% R. ICA, 60-79% LICA. Recommendation is for followup in 6 months.

## 2012-07-10 ENCOUNTER — Telehealth: Payer: Self-pay | Admitting: Cardiology

## 2012-07-10 NOTE — Telephone Encounter (Signed)
New Problem:    Patient's husband returned Lynn's call regarding his wife's Carotid results.  Please call back.

## 2012-11-06 ENCOUNTER — Encounter (HOSPITAL_COMMUNITY): Payer: Self-pay | Admitting: *Deleted

## 2012-11-06 ENCOUNTER — Emergency Department (HOSPITAL_COMMUNITY)
Admission: EM | Admit: 2012-11-06 | Discharge: 2012-11-06 | Disposition: A | Discharge status: Home / Self Care | Payer: Medicare Other | Attending: Emergency Medicine | Admitting: Emergency Medicine

## 2012-11-06 ENCOUNTER — Emergency Department (HOSPITAL_COMMUNITY): Payer: Medicare Other

## 2012-11-06 DIAGNOSIS — G309 Alzheimer's disease, unspecified: Secondary | ICD-10-CM | POA: Insufficient documentation

## 2012-11-06 DIAGNOSIS — Z8719 Personal history of other diseases of the digestive system: Secondary | ICD-10-CM | POA: Insufficient documentation

## 2012-11-06 DIAGNOSIS — Z7982 Long term (current) use of aspirin: Secondary | ICD-10-CM | POA: Insufficient documentation

## 2012-11-06 DIAGNOSIS — Z7902 Long term (current) use of antithrombotics/antiplatelets: Secondary | ICD-10-CM | POA: Insufficient documentation

## 2012-11-06 DIAGNOSIS — Z87891 Personal history of nicotine dependence: Secondary | ICD-10-CM | POA: Insufficient documentation

## 2012-11-06 DIAGNOSIS — R0789 Other chest pain: Secondary | ICD-10-CM | POA: Insufficient documentation

## 2012-11-06 DIAGNOSIS — Z792 Long term (current) use of antibiotics: Secondary | ICD-10-CM | POA: Insufficient documentation

## 2012-11-06 DIAGNOSIS — Z794 Long term (current) use of insulin: Secondary | ICD-10-CM | POA: Insufficient documentation

## 2012-11-06 DIAGNOSIS — F0280 Dementia in other diseases classified elsewhere without behavioral disturbance: Secondary | ICD-10-CM | POA: Insufficient documentation

## 2012-11-06 DIAGNOSIS — Z79899 Other long term (current) drug therapy: Secondary | ICD-10-CM | POA: Insufficient documentation

## 2012-11-06 DIAGNOSIS — F411 Generalized anxiety disorder: Secondary | ICD-10-CM | POA: Insufficient documentation

## 2012-11-06 DIAGNOSIS — I2510 Atherosclerotic heart disease of native coronary artery without angina pectoris: Secondary | ICD-10-CM | POA: Insufficient documentation

## 2012-11-06 DIAGNOSIS — E119 Type 2 diabetes mellitus without complications: Secondary | ICD-10-CM | POA: Insufficient documentation

## 2012-11-06 LAB — BASIC METABOLIC PANEL
BUN: 33 mg/dL — ABNORMAL HIGH (ref 6–23)
Chloride: 103 mEq/L (ref 96–112)
Glucose, Bld: 81 mg/dL (ref 70–99)
Potassium: 4.5 mEq/L (ref 3.5–5.1)

## 2012-11-06 LAB — CBC
HCT: 37.7 % (ref 36.0–46.0)
Hemoglobin: 12.3 g/dL (ref 12.0–15.0)
MCH: 29.2 pg (ref 26.0–34.0)
MCV: 89.5 fL (ref 78.0–100.0)
WBC: 7.4 10*3/uL (ref 4.0–10.5)

## 2012-11-06 LAB — POCT I-STAT TROPONIN I

## 2012-11-06 NOTE — ED Provider Notes (Signed)
CSN: [id number]     Arrival date & time 11/06/12  1228 History   First MD Initiated Contact with Patient 11/06/12 1234     Chief Complaint  Patient presents with  . Chest Pain   (Consider location/radiation/quality/duration/timing/severity/associated sxs/prior Treatment) HPI Comments: 79 y/o female with history of CAD, NSTEMI 2/14 s/p DES, alzheimer's dementia presenting with chest pain. She lives at [facility] memory care unit. She reportedly told the staff that she was having substernal chest pain this morning without radiation. She currently denies pain and does not remember details of this morning. She was given Nitro SL x 3 by staff at facility and 324 mg aspirin by EMS. She denies SOB, palpitations, fever, cough.   The history is provided by the patient and a relative. No language interpreter was used.    Past Medical History  Diagnosis Date  . Carotid artery disease     Doppler,  April, 2013, 0-39% R. ICA,  60-79% LICA.  Some progression on the left, plan followup 6 months  . CAD (coronary artery disease)     s/p NSTEMI Feb 2014 - PTCA/PCI with DES to mid-LAD  . Ejection fraction   . GERD (gastroesophageal reflux disease)   . Cough     May be related to GERD  . Alzheimer's dementia   . Diabetes mellitus     insulin dependent  . Anxiety   . Mitral regurgitation     Mild, echo, February, 2014   Past Surgical History  Procedure Laterality Date  . Cholecystectomy     No family history on file. History  Substance Use Topics  . Smoking status: Former Smoker    Quit date: 12/09/1971  . Smokeless tobacco: Never Used  . Alcohol Use: No   OB History   Grav Para Term Preterm Abortions TAB SAB Ect Mult Living                 Review of Systems  Constitutional: Negative for fever, diaphoresis and activity change.  Respiratory: Negative for chest tightness and shortness of breath.   Cardiovascular: Negative for palpitations and leg swelling.  Gastrointestinal: Negative  for nausea, vomiting, abdominal pain and abdominal distention.  Musculoskeletal: Negative for back pain.  All other systems reviewed and are negative.    Allergies  Review of patient's allergies indicates no active allergies.  Home Medications   Current Outpatient Rx  Name  Route  Sig  Dispense  Refill  . aspirin 81 MG chewable tablet   Oral   Chew 81 mg by mouth daily.         Marland Kitchen atorvastatin (LIPITOR) 40 MG tablet   Oral   Take 40 mg by mouth daily.         . busPIRone (BUSPAR) 10 MG tablet   Oral   Take 10 mg by mouth 2 (two) times daily.         . ciprofloxacin (CIPRO) 500 MG tablet   Oral   Take 500 mg by mouth 2 (two) times daily.         . clopidogrel (PLAVIX) 75 MG tablet   Oral   Take 1 tablet (75 mg total) by mouth daily.   90 tablet   3   . donepezil (ARICEPT) 5 MG tablet   Oral   Take 5 mg by mouth at bedtime.         . furosemide (LASIX) 20 MG tablet   Oral   Take 20 mg by mouth daily.         Marland Kitchen  insulin aspart (NOVOLOG) 100 UNIT/ML injection   Subcutaneous   Inject 5 Units into the skin 2 (two) times daily.          . insulin lispro (HUMALOG) 100 UNIT/ML injection   Subcutaneous   Inject 22 Units into the skin every evening.          Marland Kitchen lisinopril (PRINIVIL,ZESTRIL) 20 MG tablet   Oral   Take 1 tablet (20 mg total) by mouth daily.   90 tablet   3   . LORazepam (ATIVAN) 0.5 MG tablet   Oral   Take 0.5 mg by mouth 2 (two) times daily.         . Memantine HCl ER (NAMENDA XR) 28 MG CP24   Oral   Take 28 mg by mouth 2 (two) times daily.          . metoprolol tartrate (LOPRESSOR) 25 MG tablet   Oral   Take 25 mg by mouth 2 (two) times daily.         Marland Kitchen venlafaxine (EFFEXOR) 75 MG tablet   Oral   Take 75 mg by mouth 2 (two) times daily.         Marland Kitchen zolpidem (AMBIEN) 10 MG tablet   Oral   Take 10 mg by mouth at bedtime as needed for sleep.          BP 117/96  Pulse 65  Temp(Src) 98.3 F (36.8 C) (Oral)  Resp  18  SpO2 100% Physical Exam  Vitals reviewed. Constitutional: She appears well-developed and well-nourished. No distress.  HENT:  Head: Normocephalic and atraumatic.  Eyes: Conjunctivae are normal.  Neck: Neck supple. No JVD present. No tracheal deviation present.  Cardiovascular: Normal rate, regular rhythm, normal heart sounds and intact distal pulses.   Pulmonary/Chest: Effort normal and breath sounds normal. She exhibits no tenderness.  Abdominal: Soft. Bowel sounds are normal. She exhibits no distension. There is no tenderness.  Musculoskeletal: She exhibits no edema.  Neurological: She is alert. GCS eye subscore is 4. GCS verbal subscore is 4. GCS motor subscore is 6.  Skin: Skin is warm and dry.  Psychiatric: She exhibits abnormal recent memory.    ED Course  Procedures (including critical care time) Labs Review Labs Reviewed  BASIC METABOLIC PANEL - Abnormal; Notable for the following:    BUN 33 (*)    Creatinine, Ser 1.47 (*)    GFR calc non Af Amer 33 (*)    GFR calc Af Amer 39 (*)    All other components within normal limits  PRO B NATRIURETIC PEPTIDE - Abnormal; Notable for the following:    Pro B Natriuretic peptide (BNP) 1344.0 (*)    All other components within normal limits  CBC  POCT I-STAT TROPONIN I   Imaging Review Dg Chest 2 View  11/06/2012   CLINICAL DATA:  Chest tightness.  Dementia.  EXAM: CHEST  2 VIEW  COMPARISON:  None.  FINDINGS: Mild cardiac enlargement is present. With status changes are noted. Mild interstitial coarsening is chronic. No focal airspace consolidation is evident. The visualized soft tissues and bony thorax are unremarkable.  IMPRESSION: 1. No acute cardiopulmonary disease. 2. Stable mild cardiac enlargement. 3. Mild chronic interstitial coarsening is stable as well.   Electronically Signed   By: Gennette Pac M.D.   On: 11/06/2012 13:37    Date: 11/06/2012  Rate: 60  Rhythm: normal sinus rhythm  QRS Axis: normal  Intervals:  normal  ST/T Wave abnormalities: normal  Conduction Disutrbances:none  Narrative Interpretation: sinus rhythm, old inferior infarct, no ischemic changes  Old EKG Reviewed: unchanged   MDM  No diagnosis found.  79 y/o female with history of CAD, NSTEMI s/p DES, dementia presenting after episode of substernal chest pain. Unable to obtain further details of symptoms due to patients baseline dementia. Pain now resolved. AFVSS. Exam benign. Will get cardiac labs. Plan for discharge if normal.   Troponin negative. BNP elevated but no clinical evidence of CHF. Cr chronically elevated, no acute injury. I have evaluated for ACS, dissection, PNA, pneumothorax, CHF and believe patient to be low risk. Appropriate for discharge with PCP follow up. Return precautions discussed and patient/family voiced understanding.   Labs and imaging reviewed in my medical decision making. Patient discussed with my attending, Dr. Lutricia Feil, MD 11/07/12 1053

## 2012-11-06 NOTE — ED Notes (Signed)
Pt is here from memory care unit at [facility].  Pt was given 3 sl nitro by staff at SNF and 324mg  aspirin by EMS>  Pt has 22g IV in left hand.  Pt is giving me inconsistend hx of why she came to ED and where her pain is.  Per report from snf she was given maalox for "indigestion" without relief and then she was given sl nitro for "chest tightness".  Pt is alert on arrival and in no distress, grand daughter is at the bedside.

## 2012-11-06 NOTE — ED Notes (Signed)
Patient transported to X-ray 

## 2012-11-06 NOTE — ED Notes (Signed)
With yes and no questions pt has been answering "yes" when asked why she is here she said "I havent been feeling well, i have had malaise"

## 2012-11-07 NOTE — ED Provider Notes (Addendum)
I saw and evaluated the patient, reviewed the resident's note and I agree with the findings and plan. Pt w hx dementia had c/o ?cp earlier. Currently denies any cp, and doesn't recall any earlier cp. Chest cta. Rrr. Labs. Cxr.  i reviewed ecg and agree with residents interpret.   Suzi Roots, MD 11/07/12 1334  Suzi Roots, MD 11/16/12 [phone]

## 2012-12-07 ENCOUNTER — Other Ambulatory Visit: Payer: Self-pay

## 2013-02-15 ENCOUNTER — Ambulatory Visit (HOSPITAL_COMMUNITY)
Admission: RE | Admit: 2013-02-15 | Discharge: 2013-02-15 | Disposition: A | Discharge status: Home / Self Care | Payer: Medicare Other | Source: Ambulatory Visit | Attending: Geriatric Medicine | Admitting: Geriatric Medicine

## 2013-02-15 ENCOUNTER — Other Ambulatory Visit (HOSPITAL_COMMUNITY): Payer: Self-pay | Admitting: Geriatric Medicine

## 2013-02-15 DIAGNOSIS — I739 Peripheral vascular disease, unspecified: Secondary | ICD-10-CM | POA: Insufficient documentation

## 2013-02-15 DIAGNOSIS — L98499 Non-pressure chronic ulcer of skin of other sites with unspecified severity: Secondary | ICD-10-CM

## 2013-02-15 DIAGNOSIS — L97509 Non-pressure chronic ulcer of other part of unspecified foot with unspecified severity: Secondary | ICD-10-CM | POA: Insufficient documentation

## 2013-02-15 NOTE — Progress Notes (Addendum)
VASCULAR LAB PRELIMINARY  ARTERIAL  ABI completed:    RIGHT    LEFT    PRESSURE WAVEFORM  PRESSURE WAVEFORM  BRACHIAL 99 biphasic BRACHIAL 105 triphasic  DP   DP    AT 97 monophasic AT 102 monophasic  PT 136 triphasic PT 101 biphasic  PER   PER    GREAT TOE  adequate GREAT TOE  Not adequate    RIGHT LEFT  ABI >1.0 0.97     Orvill Coulthard, RVT 02/15/2013, 10:18 AM

## 2013-08-09 ENCOUNTER — Emergency Department (HOSPITAL_COMMUNITY): Payer: Medicare Other

## 2013-08-09 ENCOUNTER — Emergency Department (HOSPITAL_COMMUNITY)
Admission: EM | Admit: 2013-08-09 | Discharge: 2013-08-09 | Disposition: A | Discharge status: Home / Self Care | Payer: Medicare Other | Attending: Emergency Medicine | Admitting: Emergency Medicine

## 2013-08-09 ENCOUNTER — Encounter (HOSPITAL_COMMUNITY): Payer: Self-pay | Admitting: Emergency Medicine

## 2013-08-09 DIAGNOSIS — Z79899 Other long term (current) drug therapy: Secondary | ICD-10-CM | POA: Insufficient documentation

## 2013-08-09 DIAGNOSIS — F411 Generalized anxiety disorder: Secondary | ICD-10-CM | POA: Insufficient documentation

## 2013-08-09 DIAGNOSIS — G309 Alzheimer's disease, unspecified: Secondary | ICD-10-CM | POA: Insufficient documentation

## 2013-08-09 DIAGNOSIS — Z87891 Personal history of nicotine dependence: Secondary | ICD-10-CM | POA: Insufficient documentation

## 2013-08-09 DIAGNOSIS — Z794 Long term (current) use of insulin: Secondary | ICD-10-CM | POA: Insufficient documentation

## 2013-08-09 DIAGNOSIS — Z8719 Personal history of other diseases of the digestive system: Secondary | ICD-10-CM | POA: Insufficient documentation

## 2013-08-09 DIAGNOSIS — Z7982 Long term (current) use of aspirin: Secondary | ICD-10-CM | POA: Insufficient documentation

## 2013-08-09 DIAGNOSIS — Z7902 Long term (current) use of antithrombotics/antiplatelets: Secondary | ICD-10-CM | POA: Insufficient documentation

## 2013-08-09 DIAGNOSIS — E119 Type 2 diabetes mellitus without complications: Secondary | ICD-10-CM | POA: Insufficient documentation

## 2013-08-09 DIAGNOSIS — I5023 Acute on chronic systolic (congestive) heart failure: Secondary | ICD-10-CM | POA: Insufficient documentation

## 2013-08-09 DIAGNOSIS — F0280 Dementia in other diseases classified elsewhere without behavioral disturbance: Secondary | ICD-10-CM | POA: Insufficient documentation

## 2013-08-09 DIAGNOSIS — I2510 Atherosclerotic heart disease of native coronary artery without angina pectoris: Secondary | ICD-10-CM | POA: Insufficient documentation

## 2013-08-09 LAB — CBC
HCT: 42.6 % (ref 36.0–46.0)
Hemoglobin: 14.3 g/dL (ref 12.0–15.0)
MCH: 31.9 pg (ref 26.0–34.0)
MCHC: 33.6 g/dL (ref 30.0–36.0)
MCV: 95.1 fL (ref 78.0–100.0)
Platelets: 218 10*3/uL (ref 150–400)
RBC: 4.48 MIL/uL (ref 3.87–5.11)
RDW: 13.1 % (ref 11.5–15.5)
WBC: 7.8 10*3/uL (ref 4.0–10.5)

## 2013-08-09 LAB — BASIC METABOLIC PANEL
Anion gap: 13 (ref 5–15)
BUN: 31 mg/dL — ABNORMAL HIGH (ref 6–23)
CALCIUM: 9.9 mg/dL (ref 8.4–10.5)
CO2: 25 meq/L (ref 19–32)
CREATININE: 1.14 mg/dL — AB (ref 0.50–1.10)
Chloride: 103 mEq/L (ref 96–112)
GFR calc Af Amer: 52 mL/min — ABNORMAL LOW (ref >90)
GFR calc non Af Amer: 45 mL/min — ABNORMAL LOW (ref >90)
Glucose, Bld: 108 mg/dL — ABNORMAL HIGH (ref 70–99)
Potassium: 5 mEq/L (ref 3.7–5.3)
Sodium: 141 mEq/L (ref 137–147)

## 2013-08-09 LAB — TROPONIN I: Troponin I: <0.3 ng/mL (ref <0.30)

## 2013-08-09 MED ORDER — FUROSEMIDE 10 MG/ML IJ SOLN
40.0000 mg | Freq: Once | INTRAMUSCULAR | Status: AC
  Administered 2013-08-09: 40 mg via INTRAVENOUS
  Filled 2013-08-09: qty 4

## 2013-08-09 MED ORDER — LEVOFLOXACIN 500 MG PO TABS: 500.0000 mg | ORAL_TABLET | Freq: Once | ORAL | Status: DC

## 2013-08-09 MED ORDER — IOHEXOL 350 MG/ML SOLN
100.0000 mL | Freq: Once | INTRAVENOUS | Status: AC | PRN
  Administered 2013-08-09: 100 mL via INTRAVENOUS

## 2013-08-09 NOTE — Discharge Instructions (Signed)
Take your lasix (20mg ) twice daily for three days starting tomorrow morning. Follow up with your doctor regarding further treatment.

## 2013-08-09 NOTE — ED Provider Notes (Signed)
CSN: [id number]     Arrival date & time 08/09/13  1228 History   First MD Initiated Contact with Patient 08/09/13 1241     Chief Complaint  Patient presents with  . Low oxygen saturation     Level V caveat: Dementia  HPI Patient presents emergency department from her memory care unit with reports that her oxygen saturation dropped to 77% while working with physical therapy.  Family reports no increased confusion and no recent illness.  Patient has a history of dementia and therefore is unable to provide any reasonable history.  Daughter reports patient is at baseline now status currently.  Patient has been gaining weight over the past several months and has increased her pain size several sizes per the daughter.  There is been significant immobility for the patient.  No reports of unilateral leg or arm swelling.  Patient has a history of coronary artery disease and mild mitral regurgitation per echocardiogram report February 2014   Past Medical History  Diagnosis Date  . Carotid artery disease     Doppler,  April, 2013, 0-39% R. ICA,  60-79% LICA.  Some progression on the left, plan followup 6 months  . CAD (coronary artery disease)     s/p NSTEMI Feb 2014 - PTCA/PCI with DES to mid-LAD  . Ejection fraction   . GERD (gastroesophageal reflux disease)   . Cough     May be related to GERD  . Alzheimer's dementia   . Diabetes mellitus     insulin dependent  . Anxiety   . Mitral regurgitation     Mild, echo, February, 2014   Past Surgical History  Procedure Laterality Date  . Cholecystectomy     No family history on file. History  Substance Use Topics  . Smoking status: Former Smoker    Quit date: 12/09/1971  . Smokeless tobacco: Never Used  . Alcohol Use: No   OB History   Grav Para Term Preterm Abortions TAB SAB Ect Mult Living                 Review of Systems  All other systems reviewed and are negative.     Allergies  Review of patient's allergies indicates no  known allergies.  Home Medications   Prior to Admission medications   Medication Sig Start Date End Date Taking? Authorizing Provider  acetaminophen (TYLENOL) 325 MG tablet Take 650 mg by mouth every 6 (six) hours as needed for mild pain.   Yes Historical Provider, MD  aspirin 81 MG chewable tablet Chew 81 mg by mouth daily.   Yes Historical Provider, MD  atorvastatin (LIPITOR) 40 MG tablet Take 40 mg by mouth daily.   Yes Historical Provider, MD  busPIRone (BUSPAR) 30 MG tablet Take 30 mg by mouth 2 (two) times daily.   Yes Historical Provider, MD  clonazePAM (KLONOPIN) 1 MG tablet Take 1 mg by mouth at bedtime.   Yes Historical Provider, MD  clonazePAM (KLONOPIN) 2 MG tablet Take 2 mg by mouth at bedtime.   Yes Historical Provider, MD  clopidogrel (PLAVIX) 75 MG tablet Take 1 tablet (75 mg total) by mouth daily. 07/03/12  Yes Luis Abed, MD  donepezil (ARICEPT) 5 MG tablet Take 5 mg by mouth at bedtime.   Yes Historical Provider, MD  insulin aspart (NOVOLOG) 100 UNIT/ML injection Inject 5 Units into the skin 2 (two) times daily.    Yes Historical Provider, MD  insulin glargine (LANTUS) 100 UNIT/ML injection Inject 28  Units into the skin at bedtime.   Yes Historical Provider, MD  ketoconazole (NIZORAL) 2 % cream Apply 1 application topically 2 (two) times daily. 07/27/13  Yes Historical Provider, MD  lisinopril (PRINIVIL,ZESTRIL) 10 MG tablet Take 10 mg by mouth daily.   Yes Historical Provider, MD  Memantine HCl ER (NAMENDA XR) 28 MG CP24 Take 28 mg by mouth daily.    Yes Historical Provider, MD  metoprolol tartrate (LOPRESSOR) 25 MG tablet Take 25 mg by mouth 2 (two) times daily.   Yes Historical Provider, MD  nitroGLYCERIN (NITROSTAT) 0.4 MG SL tablet Place 0.4 mg under the tongue every 5 (five) minutes as needed for chest pain.   Yes Historical Provider, MD  traZODone (DESYREL) 100 MG tablet Take 100 mg by mouth at bedtime.   Yes Historical Provider, MD  traZODone (DESYREL) 25 mg TABS  tablet Take 25 mg by mouth 2 (two) times daily.   Yes Historical Provider, MD  venlafaxine (EFFEXOR) 75 MG tablet Take 75 mg by mouth 2 (two) times daily.   Yes Historical Provider, MD  furosemide (LASIX) 20 MG tablet Take 20 mg by mouth daily.    Historical Provider, MD   BP 144/57  Pulse 64  Temp(Src) 98.2 F (36.8 C) (Oral)  Resp 16  SpO2 95% Physical Exam  Nursing note and vitals reviewed. Constitutional: She appears well-developed and well-nourished. No distress.  HENT:  Head: Normocephalic and atraumatic.  Eyes: EOM are normal.  Neck: Normal range of motion.  Cardiovascular: Normal rate, regular rhythm and normal heart sounds.   Pulmonary/Chest: Effort normal and breath sounds normal.  Abdominal: Soft. She exhibits no distension. There is no tenderness.  Musculoskeletal: Normal range of motion. She exhibits no edema and no tenderness.  Neurological: She is alert.  Follow simple commands  Skin: Skin is warm and dry.  Psychiatric: She has a normal mood and affect. Judgment normal.    ED Course  Procedures (including critical care time) Labs Review Labs Reviewed  BASIC METABOLIC PANEL - Abnormal; Notable for the following:    Glucose, Bld 108 (*)    BUN 31 (*)    Creatinine, Ser 1.14 (*)    GFR calc non Af Amer 45 (*)    GFR calc Af Amer 52 (*)    All other components within normal limits  CBC  TROPONIN I    Imaging Review Dg Chest 2 View  08/09/2013   CLINICAL DATA:  Chills with low pulse ox.  EXAM: CHEST  2 VIEW  COMPARISON:  11/06/2012  FINDINGS: No focal airspace consolidation. There is some minimal basilar opacity which is probably atelectatic, but pneumonia is not excluded. No pulmonary edema. No pleural effusion. The cardio pericardial silhouette is enlarged. Imaged bony structures of the thorax are intact.  IMPRESSION: Cardiomegaly with bibasilar patchy opacity likely atelectatic although infection excluded.   Electronically Signed   By: Kennith Center M.D.   On:  08/09/2013 13:58     EKG Interpretation   Date/Time:  Thursday August 09 2013 14:29:45 EDT Ventricular Rate:  64 PR Interval:  192 QRS Duration: 85 QT Interval:  408 QTC Calculation: 421 R Axis:   -38 Text Interpretation:  Sinus rhythm Anterolateral infarct, age  indeterminate No significant change was found Confirmed by Darrold Bezek  MD,  Caryn Bee ([id number]) on 08/09/2013 2:33:20 PM      MDM   Final diagnoses:  None    Chest x-ray without obvious etiology.  Given how low her O2 sats were  a CT scan of her chest will be performed to evaluate for PE/pneumonia.  With her 2 saturation being 95-97% on room air and her being mainly in mobile at the facility I think if her CT scan is normal she can be discharged home with follow up with her primary care physician. Care to Dr Gwendolyn GrantWalden    Lyanne CoKevin M Charne Mcbrien, MD 08/09/13 [phone]

## 2013-08-09 NOTE — ED Notes (Signed)
Bed: [id number] Expected date:  Expected time:  Means of arrival:  Comments: EMS- low O2 sat, weakness

## 2013-08-09 NOTE — ED Notes (Signed)
[facility] EMS[facility] saturation dropped to 77% while working with physical therapy. EMS state the pt stayed at 100% while transporting the pt on 2L O2. Pt has hx of dementia; nursing home staff state the pt is at normal mental baseline status. Staff state the pt also seems very weak. Pt currently 97% on RA upon arrival to ED.

## 2013-08-09 NOTE — ED Provider Notes (Signed)
Care assumed from Dr. Patria Maneampos with CT scan pending.    CT shows no PE, but mild pulmonary edema.  On re-exam, she was comfortable without respiratory distress or increased WOB, and had O2 sats of 100% on RA.  I discussed her CT findings with her daughter.  We discussed admission vs discharge back to her memory care unit.  Ultimately, we agreed to attempt outpatient management.  I suspect that her symptoms have been gradually developing over past 2 months (as evidenced by weight gain in dependent areas).  Her daughter also notes that she is mostly bedbound and never gets up without assistance.   Will give dose of IV lasix here and recommend increased lasix at home.  To follow up with Dr. Pete GlatterStoneking.    Clinical Impression: 1. Acute on chronic systolic congestive heart failure       Savannah ChurnJohn David Dilraj Killgore III, MD 08/09/13 1905

## 2013-12-02 DEATH — deceased

## 2014-01-10 ENCOUNTER — Encounter (HOSPITAL_COMMUNITY): Payer: Self-pay | Admitting: Cardiology

## 2014-02-17 IMAGING — CR DG CHEST 2V
1 series · 1 of 1 positions shown · non-contrast
Comparison: None.

CLINICAL DATA: Chest tightness.  Dementia.

EXAM:
CHEST  2 VIEW

[w chest lat]
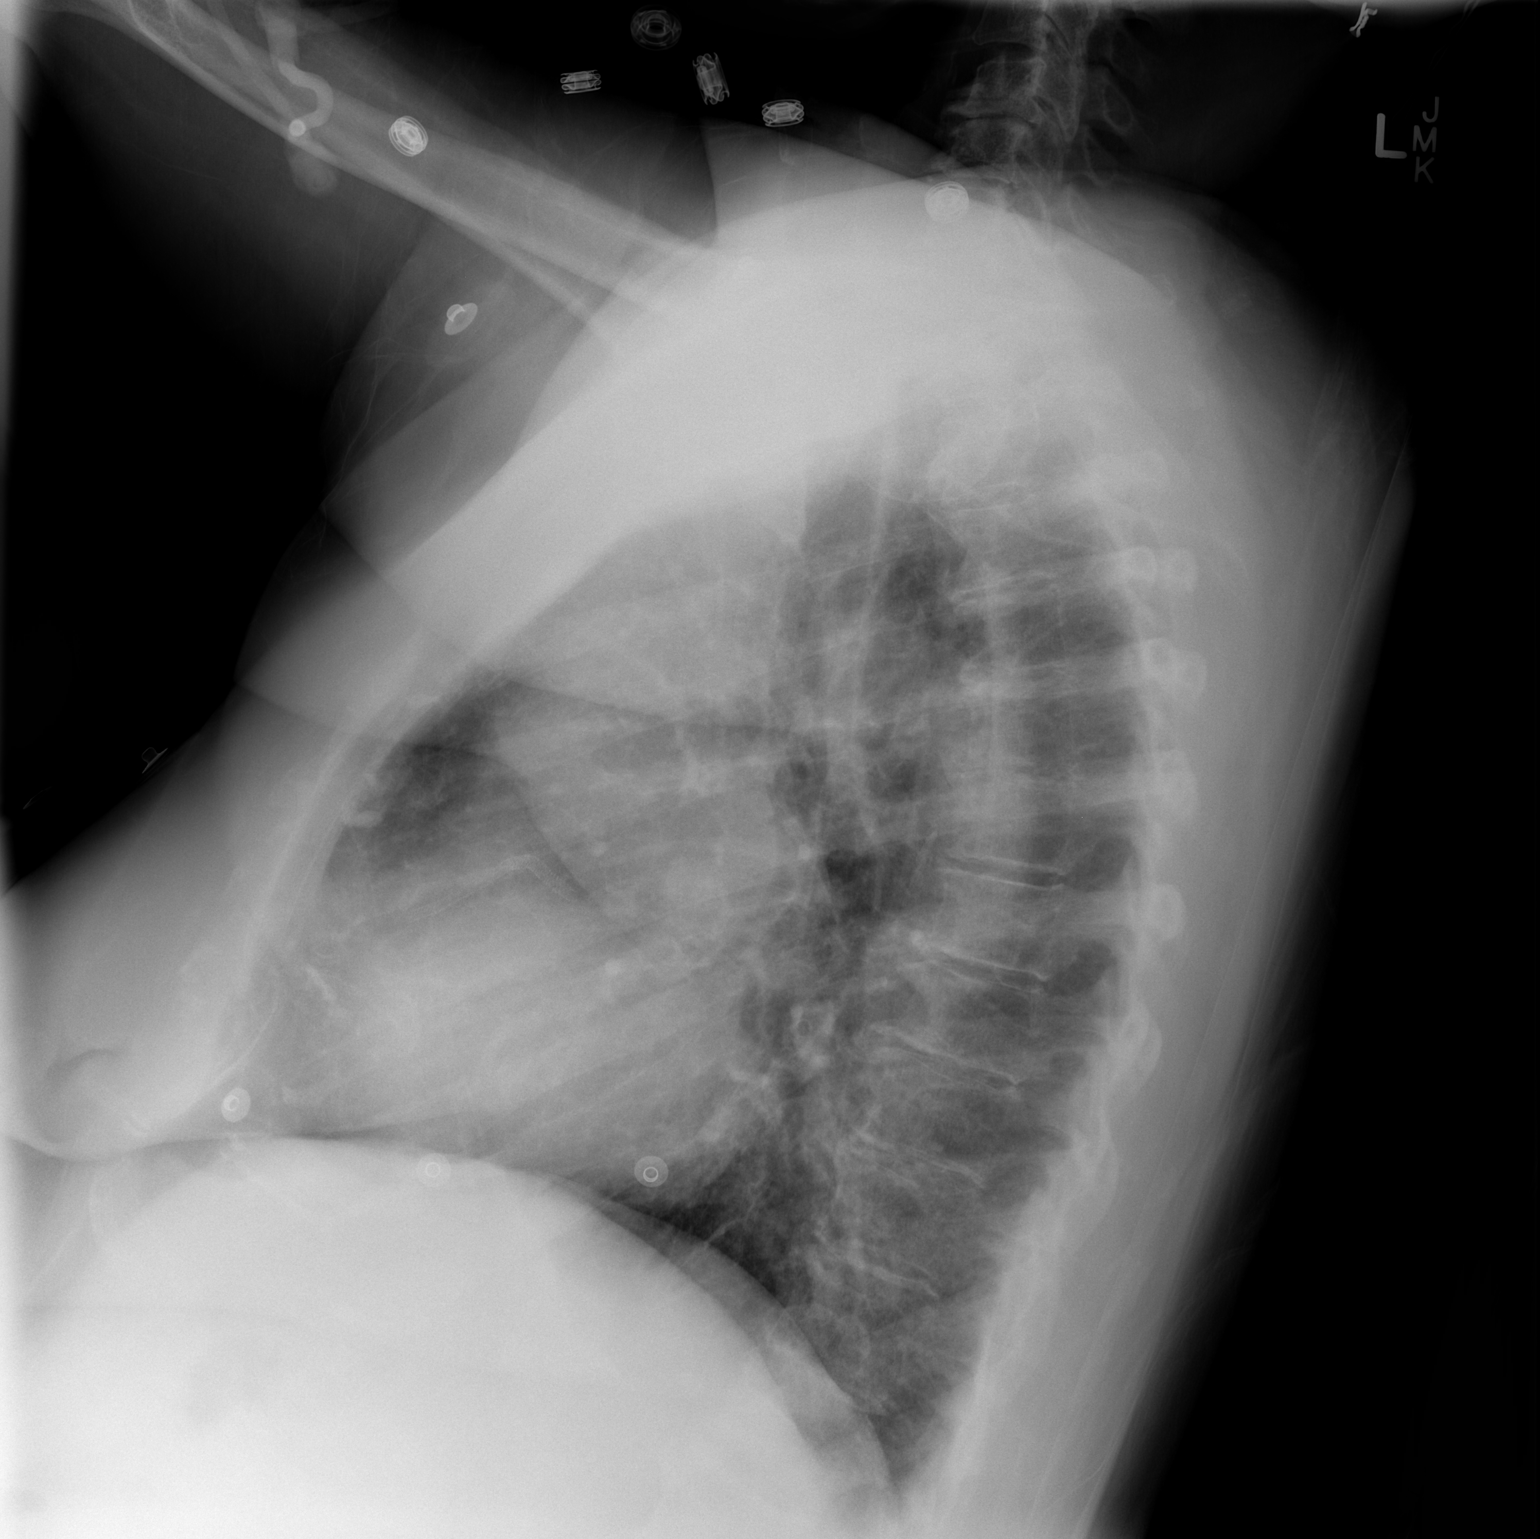

[1 of 1 positions shown; findings below may reference images not displayed]

FINDINGS: Mild cardiac enlargement is present. With status changes are noted.
Mild interstitial coarsening is chronic. No focal airspace
consolidation is evident. The visualized soft tissues and bony
thorax are unremarkable.
IMPRESSION: 1. No acute cardiopulmonary disease.
2. Stable mild cardiac enlargement.
3. Mild chronic interstitial coarsening is stable as well.

## 2014-07-29 ENCOUNTER — Other Ambulatory Visit: Payer: Self-pay

## 2014-11-20 IMAGING — CT CT ANGIO CHEST
1 of 2 series · 19 of 32 positions shown · IV contrast (omnipaque)
Comparison: Chest radiograph, 08/09/2013.

CLINICAL DATA: Decreased oxygen saturation.  History of dementia.

EXAM:
CT ANGIOGRAPHY CHEST WITH CONTRAST
TECHNIQUE: Multidetector CT imaging of the chest was performed using the
standard protocol during bolus administration of intravenous
contrast. Multiplanar CT image reconstructions and MIPs were
obtained to evaluate the vascular anatomy.
CONTRAST:  100mL OMNIPAQUE IOHEXOL 350 MG/ML SOLN

[Series 10: thins for pacs · axial · 0.64mm/px · z∈[+1556,+1780]mm · 19 of 251 slices shown]
[im 13/251  lung]
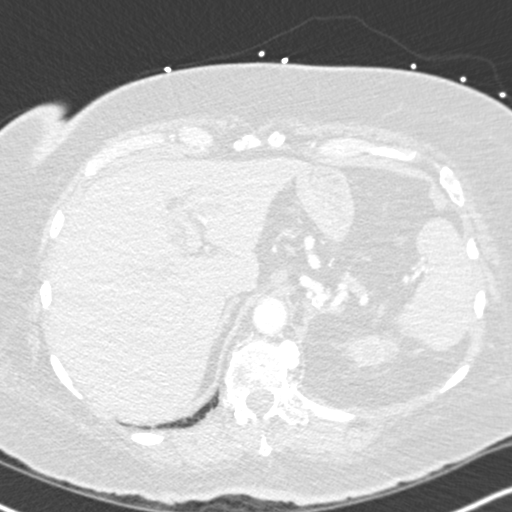
[im 26/251  mediastinal]
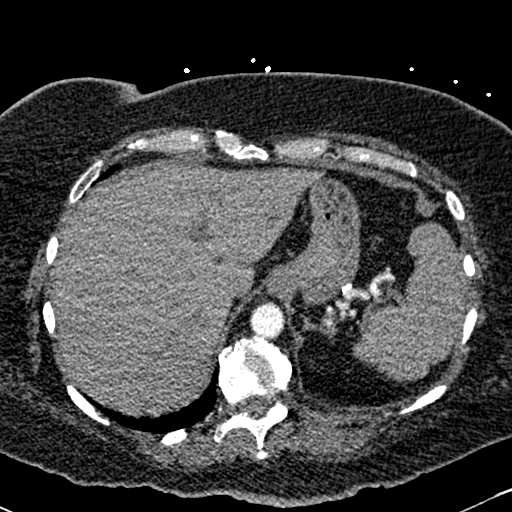
[im 38/251  lung]
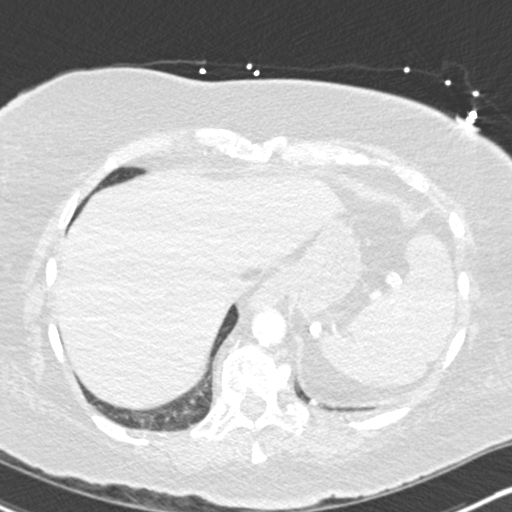
[im 63/251  mediastinal]
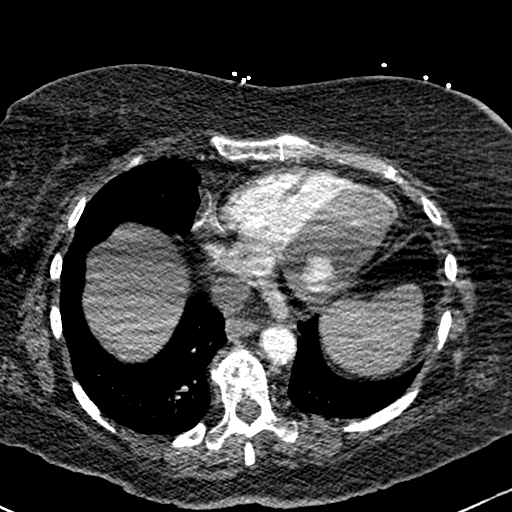
[im 76/251  lung]
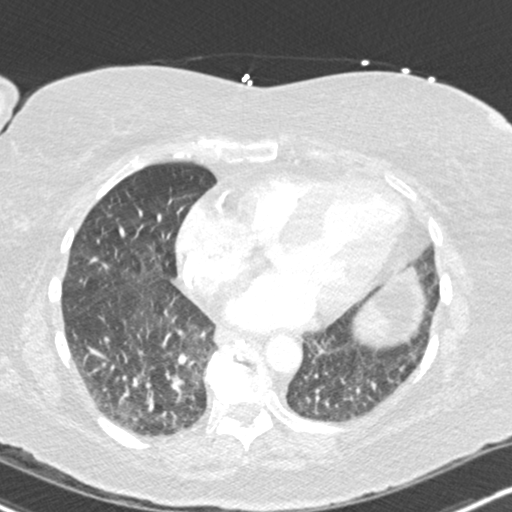
[im 84/251  mediastinal]
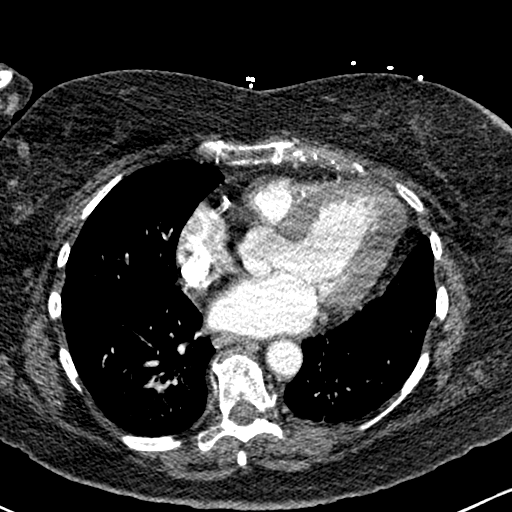
[im 88/251  lung]
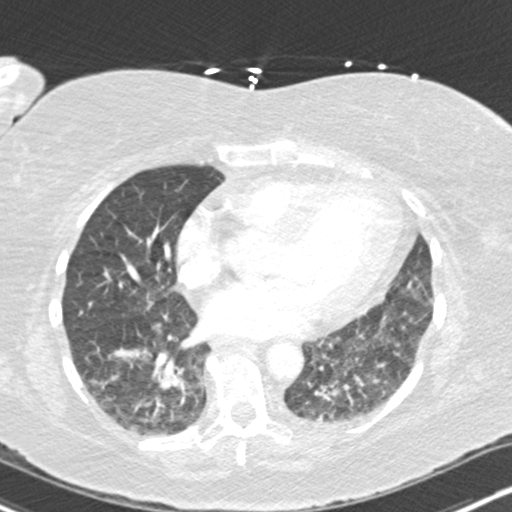
[im 101/251  mediastinal]
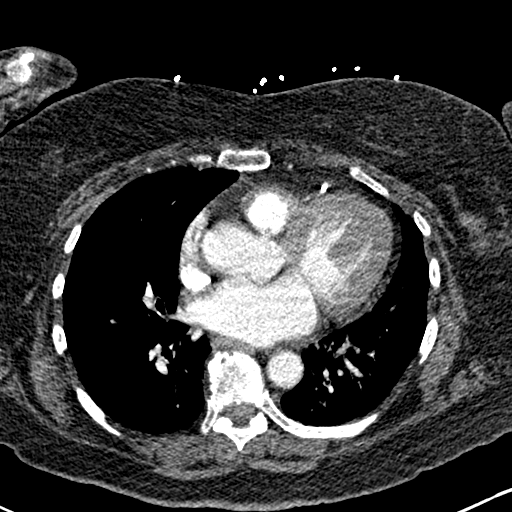
[im 113/251  lung]
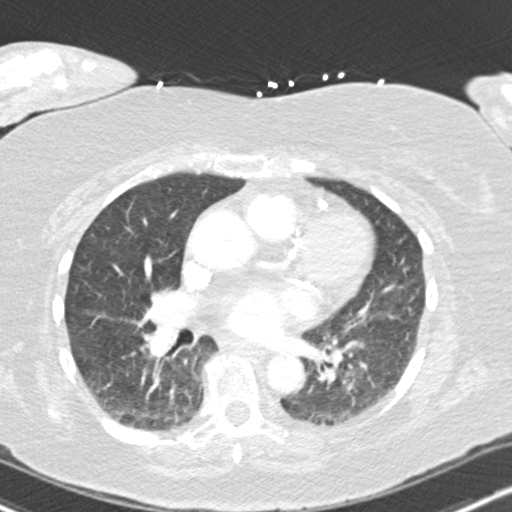
[im 126/251  mediastinal]
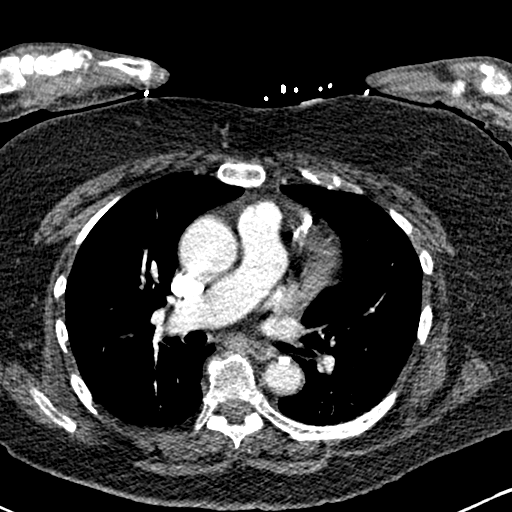
[im 138/251  lung]
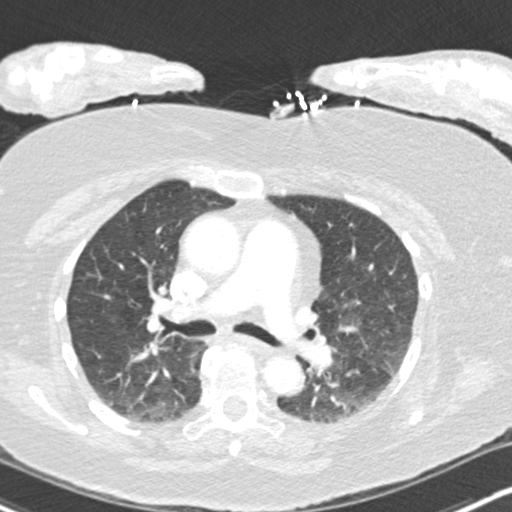
[im 151/251  mediastinal]
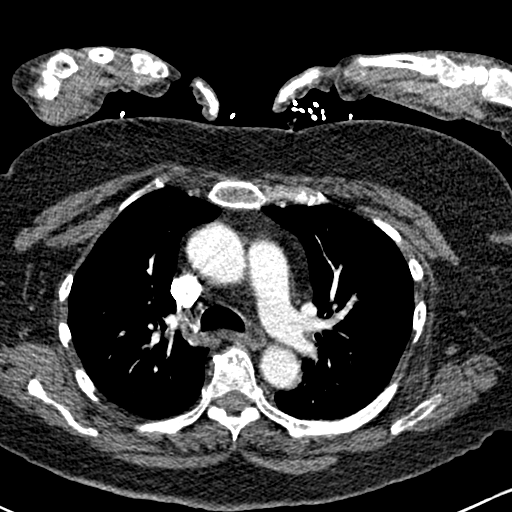
[im 163/251  lung]
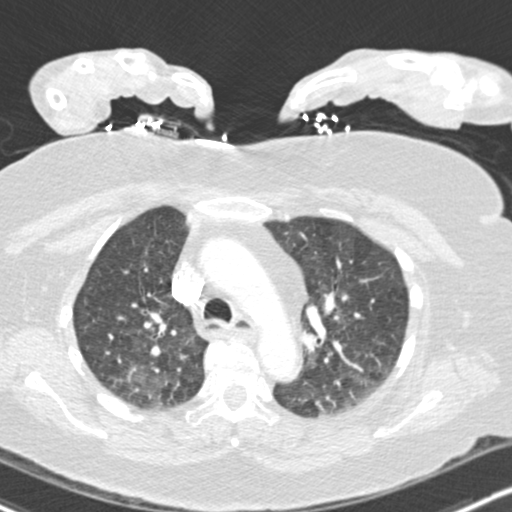
[im 167/251  mediastinal]
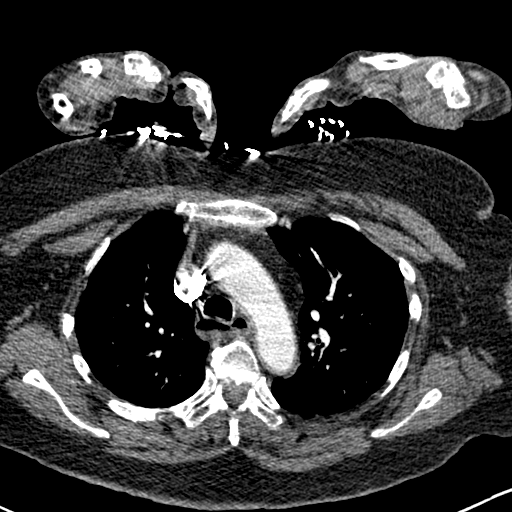
[im 176/251  lung]
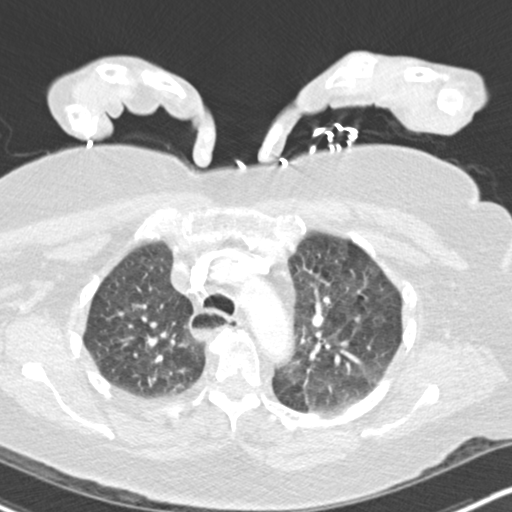
[im 188/251  mediastinal]
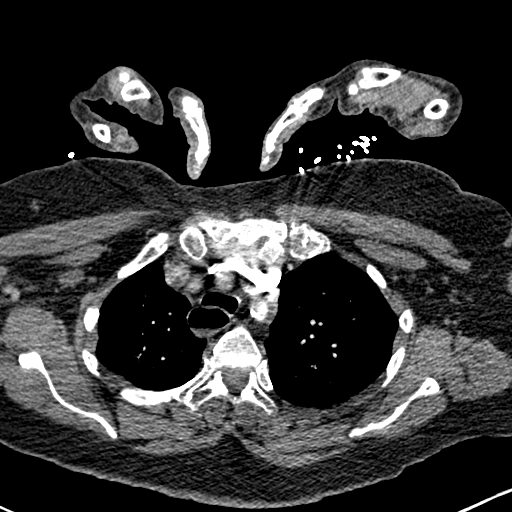
[im 213/251  lung]
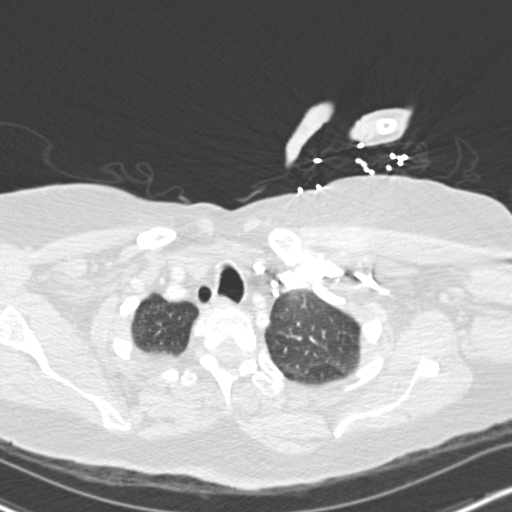
[im 226/251  mediastinal]
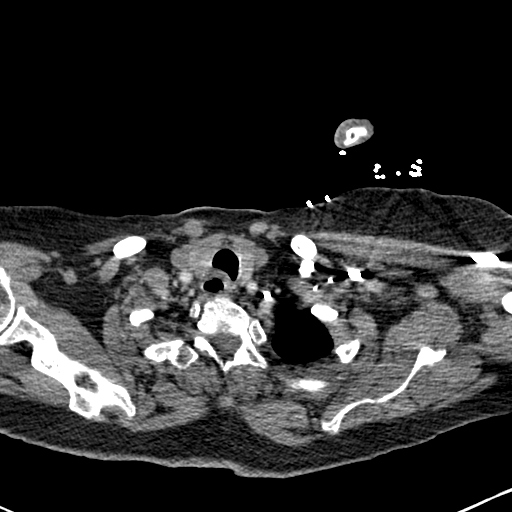
[im 238/251  lung]
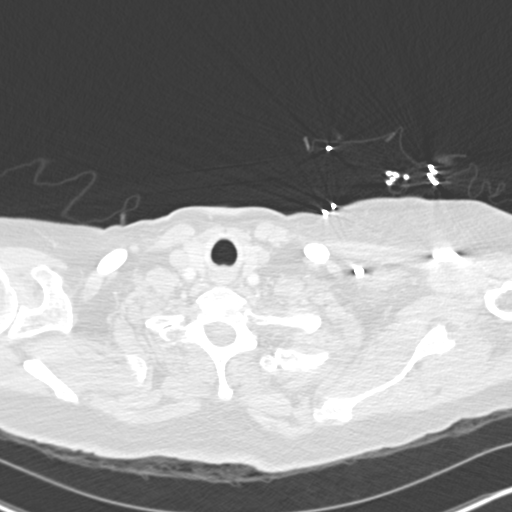

[19 of 32 positions shown; findings below may reference images not displayed]

FINDINGS: No evidence of a pulmonary embolus.

There is mild bilateral interstitial thickening with some
intervening hazy type opacity. No lung mass or nodule. No pleural
effusion.

Heart is mildly enlarged. There are dense coronary artery
calcifications. No neck base, axillary, mediastinal or hilar masses
or adenopathy.

Limited evaluation of the upper abdomen is unremarkable.

There are degenerative changes throughout the visualized spine. No
osteoblastic or osteolytic lesions.

Review of the MIP images confirms the above findings.
IMPRESSION: 1. No evidence of a pulmonary embolus.
2. Combination of cardiomegaly, interstitial thickening and mild
hazy intervening airspace opacity suggests congestive heart failure
with mild edema. No pleural effusions.
